# Patient Record
Sex: Female | Born: 1990 | Race: White | Hispanic: No | Marital: Single | Smoking: Current every day smoker
Health system: Southern US, Community
[De-identification: ages and names within clinical notes are randomized; demographics above are authoritative.]

## PROBLEM LIST (undated history)

## (undated) DIAGNOSIS — E079 Disorder of thyroid, unspecified: Secondary | ICD-10-CM

---

## 2012-07-29 ENCOUNTER — Encounter (HOSPITAL_BASED_OUTPATIENT_CLINIC_OR_DEPARTMENT_OTHER): Payer: Self-pay

## 2012-07-29 ENCOUNTER — Emergency Department (HOSPITAL_BASED_OUTPATIENT_CLINIC_OR_DEPARTMENT_OTHER)
Admission: EM | Admit: 2012-07-29 | Discharge: 2012-07-29 | Disposition: A | Discharge status: Home / Self Care | Payer: Medicaid Other | Attending: Emergency Medicine | Admitting: Emergency Medicine

## 2012-07-29 DIAGNOSIS — Z8639 Personal history of other endocrine, nutritional and metabolic disease: Secondary | ICD-10-CM | POA: Insufficient documentation

## 2012-07-29 DIAGNOSIS — Z862 Personal history of diseases of the blood and blood-forming organs and certain disorders involving the immune mechanism: Secondary | ICD-10-CM | POA: Insufficient documentation

## 2012-07-29 DIAGNOSIS — Z3202 Encounter for pregnancy test, result negative: Secondary | ICD-10-CM | POA: Insufficient documentation

## 2012-07-29 DIAGNOSIS — F17200 Nicotine dependence, unspecified, uncomplicated: Secondary | ICD-10-CM | POA: Insufficient documentation

## 2012-07-29 DIAGNOSIS — N390 Urinary tract infection, site not specified: Secondary | ICD-10-CM | POA: Insufficient documentation

## 2012-07-29 DIAGNOSIS — J029 Acute pharyngitis, unspecified: Secondary | ICD-10-CM | POA: Insufficient documentation

## 2012-07-29 DIAGNOSIS — IMO0001 Reserved for inherently not codable concepts without codable children: Secondary | ICD-10-CM | POA: Insufficient documentation

## 2012-07-29 HISTORY — DX: Disorder of thyroid, unspecified: E07.9

## 2012-07-29 LAB — URINE MICROSCOPIC-ADD ON

## 2012-07-29 LAB — URINALYSIS, ROUTINE W REFLEX MICROSCOPIC
Ketones, ur: 15 mg/dL — AB
Nitrite: NEGATIVE
Protein, ur: NEGATIVE mg/dL
Urobilinogen, UA: 0.2 mg/dL (ref 0.0–1.0)

## 2012-07-29 LAB — PREGNANCY, URINE: Preg Test, Ur: NEGATIVE

## 2012-07-29 MED ORDER — IBUPROFEN 400 MG PO TABS
600.0000 mg | ORAL_TABLET | Freq: Once | ORAL | Status: AC
  Administered 2012-07-29: 600 mg via ORAL
  Filled 2012-07-29: qty 1

## 2012-07-29 MED ORDER — CEPHALEXIN 500 MG PO CAPS: 500.0000 mg | ORAL_CAPSULE | Freq: Four times a day (QID) | ORAL | Status: DC

## 2012-07-29 NOTE — ED Notes (Signed)
C/o fever, pain to entire back and HA-started today

## 2012-07-29 NOTE — ED Provider Notes (Signed)
Medical screening examination/treatment/procedure(s) were performed by non-physician practitioner and as supervising physician I was immediately available for consultation/collaboration.   Gwyneth Sprout, MD 07/29/12 1740

## 2012-07-29 NOTE — ED Provider Notes (Signed)
   History    CSN: [id number] Arrival date & time 07/29/12  1534  First MD Initiated Contact with Patient 07/29/12 1554     Chief Complaint  Patient presents with  . Fever   (Consider location/radiation/quality/duration/timing/severity/associated sxs/prior Treatment) Patient is a 22 y.o. female presenting with fever. The history is provided by the patient. No language interpreter was used.  Fever Temp source:  Subjective Severity:  Moderate Onset quality:  Sudden Duration:  1 day Timing:  Constant Progression:  Unchanged Chronicity:  New Relieved by:  Nothing Worsened by:  Nothing tried Ineffective treatments:  None tried Associated symptoms: myalgias and sore throat   Associated symptoms: no nausea, no rash and no vomiting    Past Medical History  Diagnosis Date  . Thyroid disease    History reviewed. No pertinent past surgical history. No family history on file. History  Substance Use Topics  . Smoking status: Current Every Day Smoker  . Smokeless tobacco: Not on file  . Alcohol Use: No   OB History   Grav Para Term Preterm Abortions TAB SAB Ect Mult Living                 Review of Systems  Constitutional: Positive for fever.  HENT: Positive for sore throat.   Respiratory: Negative.   Cardiovascular: Negative.   Gastrointestinal: Negative for nausea and vomiting.  Musculoskeletal: Positive for myalgias.  Skin: Negative for rash.    Allergies  Review of patient's allergies indicates no known allergies.  Home Medications  No current outpatient prescriptions on file. BP 104/88  Pulse 100  Temp(Src) 100.2 F (37.9 C) (Oral)  Resp 18  Ht 5' (1.524 m)  Wt 106 lb (48.081 kg)  BMI 20.7 kg/m2  SpO2 100% Physical Exam  Nursing note and vitals reviewed. Constitutional: She is oriented to person, place, and time. She appears well-developed and well-nourished.  HENT:  Right Ear: External ear normal.  Left Ear: External ear normal.  Mouth/Throat:  Oropharyngeal exudate present.  Eyes: Conjunctivae and EOM are normal. Pupils are equal, round, and reactive to light.  Neck: Normal range of motion. No rigidity.  Cardiovascular: Normal rate and regular rhythm.   Pulmonary/Chest: Effort normal and breath sounds normal.  Abdominal: Soft.  Musculoskeletal: Normal range of motion.  Lymphadenopathy:    She has no cervical adenopathy.  Neurological: She is alert and oriented to person, place, and time.  Skin: Skin is warm and dry.  Psychiatric: She has a normal mood and affect.    ED Course  Procedures (including critical care time) Labs Reviewed  URINALYSIS, ROUTINE W REFLEX MICROSCOPIC - Abnormal; Notable for the following:    Hgb urine dipstick MODERATE (*)    Ketones, ur 15 (*)    Leukocytes, UA SMALL (*)    All other components within normal limits  URINE MICROSCOPIC-ADD ON - Abnormal; Notable for the following:    Squamous Epithelial / LPF MANY (*)    Bacteria, UA MANY (*)    All other components within normal limits  RAPID STREP SCREEN  URINE CULTURE  CULTURE, GROUP A STREP  PREGNANCY, URINE   No results found. 1. UTI (lower urinary tract infection)     MDM  Will treat for OZD:GUYQI pyelo:no meningeal signs  Teressa Lower, NP 07/29/12 1708

## 2012-07-31 LAB — URINE CULTURE: Colony Count: >=100000

## 2012-07-31 LAB — CULTURE, GROUP A STREP

## 2012-08-01 ENCOUNTER — Telehealth (HOSPITAL_COMMUNITY): Payer: Self-pay | Admitting: Emergency Medicine

## 2012-08-01 NOTE — ED Notes (Signed)
Post ED Visit - Positive Culture Follow-up  Culture report reviewed by antimicrobial stewardship pharmacist: []  Wes Dulaney, Pharm.D., BCPS []  Celedonio Miyamoto, Pharm.D., BCPS []  Georgina Pillion, [address].D., BCPS []  [place], [address].D., BCPS, AAHIVP []  Estella Husk, Pharm.D., BCPS, AAHIVP [x]  Okey Regal, Pharm.D., BCPS  Positive urine culture Treated with Keflex, organism sensitive to the same and no further patient follow-up is required at this time.  Kylie A Holland 08/01/2012, 6:00 PM

## 2013-01-24 ENCOUNTER — Encounter (HOSPITAL_BASED_OUTPATIENT_CLINIC_OR_DEPARTMENT_OTHER): Payer: Self-pay | Admitting: Emergency Medicine

## 2013-01-24 ENCOUNTER — Emergency Department (HOSPITAL_BASED_OUTPATIENT_CLINIC_OR_DEPARTMENT_OTHER)
Admission: EM | Admit: 2013-01-24 | Discharge: 2013-01-24 | Disposition: A | Discharge status: Home / Self Care | Payer: Medicaid Other | Attending: Emergency Medicine | Admitting: Emergency Medicine

## 2013-01-24 DIAGNOSIS — Z792 Long term (current) use of antibiotics: Secondary | ICD-10-CM | POA: Insufficient documentation

## 2013-01-24 DIAGNOSIS — F411 Generalized anxiety disorder: Secondary | ICD-10-CM | POA: Insufficient documentation

## 2013-01-24 DIAGNOSIS — Z8639 Personal history of other endocrine, nutritional and metabolic disease: Secondary | ICD-10-CM | POA: Insufficient documentation

## 2013-01-24 DIAGNOSIS — F419 Anxiety disorder, unspecified: Secondary | ICD-10-CM

## 2013-01-24 DIAGNOSIS — F17200 Nicotine dependence, unspecified, uncomplicated: Secondary | ICD-10-CM | POA: Insufficient documentation

## 2013-01-24 DIAGNOSIS — R110 Nausea: Secondary | ICD-10-CM | POA: Insufficient documentation

## 2013-01-24 DIAGNOSIS — R630 Anorexia: Secondary | ICD-10-CM | POA: Insufficient documentation

## 2013-01-24 DIAGNOSIS — R51 Headache: Secondary | ICD-10-CM | POA: Insufficient documentation

## 2013-01-24 DIAGNOSIS — Z862 Personal history of diseases of the blood and blood-forming organs and certain disorders involving the immune mechanism: Secondary | ICD-10-CM | POA: Insufficient documentation

## 2013-01-24 MED ORDER — LORAZEPAM 1 MG PO TABS
0.5000 mg | ORAL_TABLET | Freq: Once | ORAL | Status: AC
  Administered 2013-01-24: 0.5 mg via ORAL
  Filled 2013-01-24: qty 1

## 2013-01-24 NOTE — ED Provider Notes (Signed)
CSN: [id number]     Arrival date & time 01/24/13  1126 History   First MD Initiated Contact with Patient 01/24/13 1131     Chief Complaint  Patient presents with  . Anxiety   (Consider location/radiation/quality/duration/timing/severity/associated sxs/prior Treatment) The history is provided by the patient and medical records.   This is a 22 year old female with past medical history significant for thyroid disease, anxiety, presented to the ED for increased anxiety over the past week. Patient states she is under a great deal of stress-- currently in a custody battle over her daughter and is moving out of state soon.  States she has had decreased appetite and has feelings of nausea when trying to force herself to eat.  Has had multiple panic attacks over the past few days.  States 2 days ago she had a syncopal episodes following panic attack and hit the left side of her head on the floor.  Denies LOC.  Has been having mild headache ever since.  Headache described as a throbbing sensation across her entire forehead. No associated photophobia, phonophobia, aura, dizziness, tinnitus, visual disturbance, neck pain, or changes in speech.  Pt states she used to take valium for anxiety several years ago but states it never helped.  Denies possibility of pregnancy-- has mirena IUD.  No meds taken for headache PTA.  Denies SI/HI/AVH.  Past Medical History  Diagnosis Date  . Thyroid disease    History reviewed. No pertinent past surgical history. No family history on file. History  Substance Use Topics  . Smoking status: Current Every Day Smoker  . Smokeless tobacco: Not on file  . Alcohol Use: No   OB History   Grav Para Term Preterm Abortions TAB SAB Ect Mult Living                 Review of Systems  Neurological: Positive for headaches.  Psychiatric/Behavioral: The patient is nervous/anxious.   All other systems reviewed and are negative.    Allergies  Review of patient's allergies  indicates no known allergies.  Home Medications   Current Outpatient Rx  Name  Route  Sig  Dispense  Refill  . cephALEXin (KEFLEX) 500 MG capsule   Oral   Take 1 capsule (500 mg total) by mouth 4 (four) times daily.   28 capsule   0    BP 108/73  Pulse 80  Temp(Src) 98.4 F (36.9 C) (Oral)  Resp 20  Ht 5' (1.524 m)  Wt 105 lb (47.628 kg)  BMI 20.51 kg/m2  SpO2 100%  Physical Exam  Nursing note and vitals reviewed. Constitutional: She is oriented to person, place, and time. She appears well-developed and well-nourished. No distress.  Sitting upright in bed, all room lights on, NAD  HENT:  Head: Normocephalic and atraumatic. Head is without raccoon's eyes, without Battle's sign, without abrasion and without laceration.  Mouth/Throat: Oropharynx is clear and moist.  No visible signs of head trauma  Eyes: Conjunctivae and EOM are normal. Pupils are equal, round, and reactive to light.  Neck: Normal range of motion and full passive range of motion without pain. Neck supple. No rigidity.  No meningeal signs  Cardiovascular: Normal rate, regular rhythm and normal heart sounds.   Pulmonary/Chest: Effort normal and breath sounds normal.  Abdominal: Soft. Bowel sounds are normal.  Musculoskeletal: Normal range of motion.  Neurological: She is alert and oriented to person, place, and time. She has normal strength. She displays no tremor. No cranial nerve deficit or  sensory deficit. She displays no seizure activity. Gait normal.  No focal neuro deficits appreciated  Skin: Skin is warm and dry. She is not diaphoretic.  Psychiatric: She has a normal mood and affect. Her speech is normal. She is not actively hallucinating. She expresses no homicidal and no suicidal ideation. She expresses no suicidal plans and no homicidal plans.  Pt with flat affect, limited eye contact, does not appear anxious    ED Course  Procedures (including critical care time) Labs Review Labs Reviewed - No  data to display Imaging Review No results found.  EKG Interpretation   None       MDM   1. Anxiety   2. Headache    Pt denies possibility of pregnancy, IUD in place.  0.5mg  ativan given without improvement-- pt states she does not want to take any further medications in the ED or on OP basis.  Headache without focal neuro deficits-- i doubt TIA, stroke, ICH, SAH, or meningitis.  Offered referral to psychiatry/counseling center-- pt is moving out of state at the end of the week.  Strongly encouraged her to seek psychiatric/counsling care once settled, may call help line if begin experiencing SI/HI/AVH.  Discussed plan with pt, she agreed.  Return precautions advised.  Garlon Hatchet, PA-C 01/24/13 1432

## 2013-01-24 NOTE — ED Notes (Signed)
States she cant eat or sleep for the past week. Hx of anxiety. States she had a panic attack 2 days ago and fell hitting her forehead. No loc. She has had a headache since.

## 2013-01-24 NOTE — ED Provider Notes (Signed)
Medical screening examination/treatment/procedure(s) were performed by non-physician practitioner and as supervising physician I was immediately available for consultation/collaboration.  EKG Interpretation   None         Candyce Churn, MD 01/24/13 [phone]

## 2014-08-15 ENCOUNTER — Encounter (HOSPITAL_BASED_OUTPATIENT_CLINIC_OR_DEPARTMENT_OTHER): Payer: Self-pay

## 2014-08-15 ENCOUNTER — Emergency Department (HOSPITAL_BASED_OUTPATIENT_CLINIC_OR_DEPARTMENT_OTHER)
Admission: EM | Admit: 2014-08-15 | Discharge: 2014-08-15 | Disposition: A | Discharge status: Home / Self Care | Payer: Medicaid Other | Attending: Emergency Medicine | Admitting: Emergency Medicine

## 2014-08-15 DIAGNOSIS — Z3202 Encounter for pregnancy test, result negative: Secondary | ICD-10-CM | POA: Diagnosis not present

## 2014-08-15 DIAGNOSIS — Z720 Tobacco use: Secondary | ICD-10-CM | POA: Diagnosis not present

## 2014-08-15 DIAGNOSIS — R112 Nausea with vomiting, unspecified: Secondary | ICD-10-CM | POA: Diagnosis not present

## 2014-08-15 DIAGNOSIS — R197 Diarrhea, unspecified: Secondary | ICD-10-CM

## 2014-08-15 DIAGNOSIS — N390 Urinary tract infection, site not specified: Secondary | ICD-10-CM

## 2014-08-15 DIAGNOSIS — Z8639 Personal history of other endocrine, nutritional and metabolic disease: Secondary | ICD-10-CM | POA: Diagnosis not present

## 2014-08-15 LAB — COMPREHENSIVE METABOLIC PANEL
ALT: 7 U/L — AB (ref 14–54)
AST: 13 U/L — ABNORMAL LOW (ref 15–41)
Albumin: 4.2 g/dL (ref 3.5–5.0)
Alkaline Phosphatase: 75 U/L (ref 38–126)
Anion gap: 5 (ref 5–15)
BUN: 9 mg/dL (ref 6–20)
CALCIUM: 8.8 mg/dL — AB (ref 8.9–10.3)
CHLORIDE: 106 mmol/L (ref 101–111)
CO2: 25 mmol/L (ref 22–32)
Creatinine, Ser: 0.7 mg/dL (ref 0.44–1.00)
GFR calc Af Amer: >60 mL/min (ref >60)
GFR calc non Af Amer: >60 mL/min (ref >60)
Glucose, Bld: 90 mg/dL (ref 65–99)
POTASSIUM: 3.9 mmol/L (ref 3.5–5.1)
Sodium: 136 mmol/L (ref 135–145)
Total Bilirubin: 1.1 mg/dL (ref 0.3–1.2)
Total Protein: 7.3 g/dL (ref 6.5–8.1)

## 2014-08-15 LAB — URINALYSIS, ROUTINE W REFLEX MICROSCOPIC
BILIRUBIN URINE: NEGATIVE
Glucose, UA: NEGATIVE mg/dL
Ketones, ur: NEGATIVE mg/dL
Nitrite: NEGATIVE
PROTEIN: NEGATIVE mg/dL
SPECIFIC GRAVITY, URINE: 1.015 (ref 1.005–1.030)
UROBILINOGEN UA: 1 mg/dL (ref 0.0–1.0)
pH: 6.5 (ref 5.0–8.0)

## 2014-08-15 LAB — CBC WITH DIFFERENTIAL/PLATELET
Basophils Absolute: 0 10*3/uL (ref 0.0–0.1)
Basophils Relative: 0 % (ref 0–1)
EOS ABS: 0.4 10*3/uL (ref 0.0–0.7)
EOS PCT: 5 % (ref 0–5)
HEMATOCRIT: 38.6 % (ref 36.0–46.0)
Hemoglobin: 13 g/dL (ref 12.0–15.0)
LYMPHS ABS: 1.2 10*3/uL (ref 0.7–4.0)
LYMPHS PCT: 14 % (ref 12–46)
MCH: 31.1 pg (ref 26.0–34.0)
MCHC: 33.7 g/dL (ref 30.0–36.0)
MCV: 92.3 fL (ref 78.0–100.0)
MONO ABS: 0.5 10*3/uL (ref 0.1–1.0)
MONOS PCT: 7 % (ref 3–12)
NEUTROS ABS: 6.2 10*3/uL (ref 1.7–7.7)
Neutrophils Relative %: 74 % (ref 43–77)
Platelets: 198 10*3/uL (ref 150–400)
RBC: 4.18 MIL/uL (ref 3.87–5.11)
RDW: 11.8 % (ref 11.5–15.5)
WBC: 8.4 10*3/uL (ref 4.0–10.5)

## 2014-08-15 LAB — URINE MICROSCOPIC-ADD ON

## 2014-08-15 LAB — LIPASE, BLOOD: Lipase: 14 U/L — ABNORMAL LOW (ref 22–51)

## 2014-08-15 LAB — PREGNANCY, URINE: PREG TEST UR: NEGATIVE

## 2014-08-15 MED ORDER — SODIUM CHLORIDE 0.9 % IV BOLUS (SEPSIS)
1000.0000 mL | Freq: Once | INTRAVENOUS | Status: AC
  Administered 2014-08-15: 1000 mL via INTRAVENOUS

## 2014-08-15 MED ORDER — ONDANSETRON HCL 4 MG/2ML IJ SOLN
4.0000 mg | Freq: Once | INTRAMUSCULAR | Status: AC
  Administered 2014-08-15: 4 mg via INTRAVENOUS
  Filled 2014-08-15: qty 2

## 2014-08-15 MED ORDER — CEPHALEXIN 500 MG PO CAPS: 500.0000 mg | ORAL_CAPSULE | Freq: Four times a day (QID) | ORAL | Status: AC

## 2014-08-15 MED ORDER — ONDANSETRON 4 MG PO TBDP: ORAL_TABLET | ORAL | Status: DC

## 2014-08-15 NOTE — Discharge Instructions (Signed)
Is important to stay well hydrated. It is also important to take all of your antibiotic as prescribed, do not save them. Follow-up with primary care. Return to ED for any worsening symptoms.  Nausea and Vomiting Nausea is a sick feeling that often comes before throwing up (vomiting). Vomiting is a reflex where stomach contents come out of your mouth. Vomiting can cause severe loss of body fluids (dehydration). Children and elderly adults can become dehydrated quickly, especially if they also have diarrhea. Nausea and vomiting are symptoms of a condition or disease. It is important to find the cause of your symptoms. CAUSES   Direct irritation of the stomach lining. This irritation can result from increased acid production (gastroesophageal reflux disease), infection, food poisoning, taking certain medicines (such as nonsteroidal anti-inflammatory drugs), alcohol use, or tobacco use.  Signals from the brain.These signals could be caused by a headache, heat exposure, an inner ear disturbance, increased pressure in the brain from injury, infection, a tumor, or a concussion, pain, emotional stimulus, or metabolic problems.  An obstruction in the gastrointestinal tract (bowel obstruction).  Illnesses such as diabetes, hepatitis, gallbladder problems, appendicitis, kidney problems, cancer, sepsis, atypical symptoms of a heart attack, or eating disorders.  Medical treatments such as chemotherapy and radiation.  Receiving medicine that makes you sleep (general anesthetic) during surgery. DIAGNOSIS Your caregiver may ask for tests to be done if the problems do not improve after a few days. Tests may also be done if symptoms are severe or if the reason for the nausea and vomiting is not clear. Tests may include:  Urine tests.  Blood tests.  Stool tests.  Cultures (to look for evidence of infection).  X-rays or other imaging studies. Test results can help your caregiver make decisions about  treatment or the need for additional tests. TREATMENT You need to stay well hydrated. Drink frequently but in small amounts.You may wish to drink water, sports drinks, clear broth, or eat frozen ice pops or gelatin dessert to help stay hydrated.When you eat, eating slowly may help prevent nausea.There are also some antinausea medicines that may help prevent nausea. HOME CARE INSTRUCTIONS   Take all medicine as directed by your caregiver.  If you do not have an appetite, do not force yourself to eat. However, you must continue to drink fluids.  If you have an appetite, eat a normal diet unless your caregiver tells you differently.  Eat a variety of complex carbohydrates (rice, wheat, potatoes, bread), lean meats, yogurt, fruits, and vegetables.  Avoid high-fat foods because they are more difficult to digest.  Drink enough water and fluids to keep your urine clear or pale yellow.  If you are dehydrated, ask your caregiver for specific rehydration instructions. Signs of dehydration may include:  Severe thirst.  Dry lips and mouth.  Dizziness.  Dark urine.  Decreasing urine frequency and amount.  Confusion.  Rapid breathing or pulse. SEEK IMMEDIATE MEDICAL CARE IF:   You have blood or brown flecks (like coffee grounds) in your vomit.  You have black or bloody stools.  You have a severe headache or stiff neck.  You are confused.  You have severe abdominal pain.  You have chest pain or trouble breathing.  You do not urinate at least once every 8 hours.  You develop cold or clammy skin.  You continue to vomit for longer than 24 to 48 hours.  You have a fever. MAKE SURE YOU:   Understand these instructions.  Will watch your condition.  Will get help right away if you are not doing well or get worse. Document Released: 01/20/2005 Document Revised: 04/14/2011 Document Reviewed: 06/19/2010 [facility] Patient Information 2015 [place], [place]. This information is not  intended to replace advice given to you by your health care provider. Make sure you discuss any questions you have with your health care provider.  Urinary Tract Infection Urinary tract infections (UTIs) can develop anywhere along your urinary tract. Your urinary tract is your body's drainage system for removing wastes and extra water. Your urinary tract includes two kidneys, two ureters, a bladder, and a urethra. Your kidneys are a pair of bean-shaped organs. Each kidney is about the size of your fist. They are located below your ribs, one on each side of your spine. CAUSES Infections are caused by microbes, which are microscopic organisms, including fungi, viruses, and bacteria. These organisms are so small that they can only be seen through a microscope. Bacteria are the microbes that most commonly cause UTIs. SYMPTOMS  Symptoms of UTIs may vary by age and gender of the patient and by the location of the infection. Symptoms in young women typically include a frequent and intense urge to urinate and a painful, burning feeling in the bladder or urethra during urination. Older women and men are more likely to be tired, shaky, and weak and have muscle aches and abdominal pain. A fever may mean the infection is in your kidneys. Other symptoms of a kidney infection include pain in your back or sides below the ribs, nausea, and vomiting. DIAGNOSIS To diagnose a UTI, your caregiver will ask you about your symptoms. Your caregiver also will ask to provide a urine sample. The urine sample will be tested for bacteria and white blood cells. White blood cells are made by your body to help fight infection. TREATMENT  Typically, UTIs can be treated with medication. Because most UTIs are caused by a bacterial infection, they usually can be treated with the use of antibiotics. The choice of antibiotic and length of treatment depend on your symptoms and the type of bacteria causing your infection. HOME CARE  INSTRUCTIONS  If you were prescribed antibiotics, take them exactly as your caregiver instructs you. Finish the medication even if you feel better after you have only taken some of the medication.  Drink enough water and fluids to keep your urine clear or pale yellow.  Avoid caffeine, tea, and carbonated beverages. They tend to irritate your bladder.  Empty your bladder often. Avoid holding urine for long periods of time.  Empty your bladder before and after sexual intercourse.  After a bowel movement, women should cleanse from front to back. Use each tissue only once. SEEK MEDICAL CARE IF:   You have back pain.  You develop a fever.  Your symptoms do not begin to resolve within 3 days. SEEK IMMEDIATE MEDICAL CARE IF:   You have severe back pain or lower abdominal pain.  You develop chills.  You have nausea or vomiting.  You have continued burning or discomfort with urination. MAKE SURE YOU:   Understand these instructions.  Will watch your condition.  Will get help right away if you are not doing well or get worse. Document Released: 10/30/2004 Document Revised: 07/22/2011 Document Reviewed: 02/28/2011 [facility] Patient Information 2015 [place], [place]. This information is not intended to replace advice given to you by your health care provider. Make sure you discuss any questions you have with your health care provider.

## 2014-08-15 NOTE — ED Notes (Signed)
Diarrhea x 3 days, vomiting started yesterday

## 2014-08-15 NOTE — ED Provider Notes (Signed)
CSN: [id number]     Arrival date & time 08/15/14  1622 History   First MD Initiated Contact with Patient 08/15/14 1636     Chief Complaint  Patient presents with  . Diarrhea     (Consider location/radiation/quality/duration/timing/severity/associated sxs/prior Treatment) HPI Misty BailiffJessica Bruso is a 24 y.o. female who comes in for evaluation of nausea, vomiting and diarrhea. Patient states for the past 3 days she has had nausea, vomiting and diarrhea. She denies any bloody emesis or stool. She denies any overt abdominal pain. She reports more of a "bloating and tight sensation". She denies any pelvic pain, vaginal bleeding or discharge, urinary symptoms. She reports fevers at home 3 days ago of 102.3, but none since. Denies recent travel, new medications, new foods. Denies any allergies. No other aggravating or modifying factors.  Past Medical History  Diagnosis Date  . Thyroid disease    History reviewed. No pertinent past surgical history. No family history on file. History  Substance Use Topics  . Smoking status: Current Every Day Smoker  . Smokeless tobacco: Not on file  . Alcohol Use: No   OB History    No data available     Review of Systems A 10 point review of systems was completed and was negative except for pertinent positives and negatives as mentioned in the history of present illness     Allergies  Review of patient's allergies indicates no known allergies.  Home Medications   Prior to Admission medications   Medication Sig Start Date End Date Taking? Authorizing Provider  cephALEXin (KEFLEX) 500 MG capsule Take 1 capsule (500 mg total) by mouth 4 (four) times daily. 08/15/14   Joycie PeekBenjamin Sawyer Kahan, PA-C  ondansetron (ZOFRAN ODT) 4 MG disintegrating tablet 4mg  ODT q4 hours prn nausea/vomit 08/15/14   Narvel Kozub, PA-C   BP 91/56 mmHg  Pulse 53  Temp(Src) 98.5 F (36.9 C) (Oral)  Resp 16  Ht 5' (1.524 m)  Wt 115 lb (52.164 kg)  BMI 22.46 kg/m2  SpO2 100%   LMP 06/15/2014 Physical Exam  Constitutional: She is oriented to person, place, and time. She appears well-developed and well-nourished.  HENT:  Head: Normocephalic and atraumatic.  Mouth/Throat: Oropharynx is clear and moist.  Eyes: Conjunctivae are normal. Pupils are equal, round, and reactive to light. Right eye exhibits no discharge. Left eye exhibits no discharge. No scleral icterus.  Neck: Neck supple.  Cardiovascular: Normal rate, regular rhythm and normal heart sounds.   Pulmonary/Chest: Effort normal and breath sounds normal. No respiratory distress. She has no wheezes. She has no rales.  Abdominal: Soft. She exhibits no distension and no mass. There is no tenderness. There is no rebound and no guarding.  Musculoskeletal: She exhibits no tenderness.  Neurological: She is alert and oriented to person, place, and time.  Cranial Nerves II-XII grossly intact  Skin: Skin is warm and dry. No rash noted.  Psychiatric: She has a normal mood and affect.  Nursing note and vitals reviewed.   ED Course  Procedures (including critical care time) Labs Review Labs Reviewed  URINALYSIS, ROUTINE W REFLEX MICROSCOPIC ([facility]) - Abnormal; Notable for the following:    APPearance CLOUDY (*)    Hgb urine dipstick TRACE (*)    Leukocytes, UA MODERATE (*)    All other components within normal limits  LIPASE, BLOOD - Abnormal; Notable for the following:    Lipase 14 (*)    All other components within normal limits  COMPREHENSIVE METABOLIC PANEL - Abnormal;  Notable for the following:    Calcium 8.8 (*)    AST 13 (*)    ALT 7 (*)    All other components within normal limits  URINE MICROSCOPIC-ADD ON - Abnormal; Notable for the following:    Squamous Epithelial / LPF FEW (*)    Bacteria, UA MANY (*)    All other components within normal limits  PREGNANCY, URINE  CBC WITH DIFFERENTIAL/PLATELET    Imaging Review No results found.   EKG Interpretation None     Meds given in  ED:  Medications  sodium chloride 0.9 % bolus 1,000 mL (0 mLs Intravenous Stopped 08/15/14 1807)  ondansetron (ZOFRAN) injection 4 mg (4 mg Intravenous Given 08/15/14 1706)    Discharge Medication List as of 08/15/2014  6:02 PM    START taking these medications   Details  cephALEXin (KEFLEX) 500 MG capsule Take 1 capsule (500 mg total) by mouth 4 (four) times daily., Starting 08/15/2014, Until Discontinued, Print    ondansetron (ZOFRAN ODT) 4 MG disintegrating tablet  ODT q4 hours prn nausea/vomit, Print       Filed Vitals:   08/15/14 1626 08/15/14 1803  BP: 103/66 91/56  Pulse: 84 53  Temp: 98.5 F (36.9 C)   TempSrc: Oral   Resp: 18 16  Height: 5' (1.524 m)   Weight: 115 lb (52.164 kg)   SpO2: 100% 100%    MDM  Vitals stable - WNL -afebrile Pt resting comfortably in ED. Tolerating PO in the ED PE--physical exam is grossly unremarkable. Benign abdominal exam Labwork--evidence of UTI on urinalysis. Pregnancy negative. Labs otherwise noncontributory.  DDX--patient discomfort likely secondary to UTI and possible concomitant viral etiology.. Will treat empirically with antibiotic. No evidence of other acute or emergent pathology. Encourage follow-up with primary care.  I discussed all relevant lab findings and imaging results with pt and they verbalized understanding. Discussed f/u with PCP within 48 hrs and return precautions, pt very amenable to plan.  Final diagnoses:  UTI (lower urinary tract infection)  Nausea vomiting and diarrhea        Joycie Peek, PA-C 08/15/14 1833  Geoffery Lyons, MD 08/15/14 6962

## 2014-12-02 ENCOUNTER — Encounter (HOSPITAL_BASED_OUTPATIENT_CLINIC_OR_DEPARTMENT_OTHER): Payer: Self-pay

## 2014-12-02 ENCOUNTER — Emergency Department (HOSPITAL_BASED_OUTPATIENT_CLINIC_OR_DEPARTMENT_OTHER): Payer: Medicaid Other

## 2014-12-02 ENCOUNTER — Emergency Department (HOSPITAL_BASED_OUTPATIENT_CLINIC_OR_DEPARTMENT_OTHER)
Admission: EM | Admit: 2014-12-02 | Discharge: 2014-12-02 | Disposition: A | Discharge status: Home / Self Care | Payer: Medicaid Other | Attending: Emergency Medicine | Admitting: Emergency Medicine

## 2014-12-02 DIAGNOSIS — A5909 Other urogenital trichomoniasis: Secondary | ICD-10-CM | POA: Diagnosis not present

## 2014-12-02 DIAGNOSIS — R52 Pain, unspecified: Secondary | ICD-10-CM

## 2014-12-02 DIAGNOSIS — Z3202 Encounter for pregnancy test, result negative: Secondary | ICD-10-CM | POA: Insufficient documentation

## 2014-12-02 DIAGNOSIS — Z8639 Personal history of other endocrine, nutritional and metabolic disease: Secondary | ICD-10-CM | POA: Diagnosis not present

## 2014-12-02 DIAGNOSIS — Z792 Long term (current) use of antibiotics: Secondary | ICD-10-CM | POA: Diagnosis not present

## 2014-12-02 DIAGNOSIS — N72 Inflammatory disease of cervix uteri: Secondary | ICD-10-CM

## 2014-12-02 DIAGNOSIS — Z720 Tobacco use: Secondary | ICD-10-CM | POA: Diagnosis not present

## 2014-12-02 DIAGNOSIS — Z975 Presence of (intrauterine) contraceptive device: Secondary | ICD-10-CM

## 2014-12-02 DIAGNOSIS — R102 Pelvic and perineal pain: Secondary | ICD-10-CM | POA: Diagnosis present

## 2014-12-02 LAB — URINE MICROSCOPIC-ADD ON

## 2014-12-02 LAB — URINALYSIS, ROUTINE W REFLEX MICROSCOPIC
Bilirubin Urine: NEGATIVE
Glucose, UA: NEGATIVE mg/dL
Ketones, ur: 15 mg/dL — AB
Nitrite: NEGATIVE
Protein, ur: 30 mg/dL — AB
Specific Gravity, Urine: 1.021 (ref 1.005–1.030)
UROBILINOGEN UA: 1 mg/dL (ref 0.0–1.0)
pH: 6 (ref 5.0–8.0)

## 2014-12-02 LAB — WET PREP, GENITAL: Trich, Wet Prep: NONE SEEN

## 2014-12-02 LAB — PREGNANCY, URINE: PREG TEST UR: NEGATIVE

## 2014-12-02 MED ORDER — AZITHROMYCIN 250 MG PO TABS
1000.0000 mg | ORAL_TABLET | Freq: Once | ORAL | Status: AC
  Administered 2014-12-02: 1000 mg via ORAL
  Filled 2014-12-02: qty 4

## 2014-12-02 MED ORDER — CEFTRIAXONE SODIUM 1 G IJ SOLR
INTRAMUSCULAR | Status: AC
  Filled 2014-12-02: qty 10

## 2014-12-02 MED ORDER — SODIUM CHLORIDE 0.9 % IV BOLUS (SEPSIS)
1000.0000 mL | Freq: Once | INTRAVENOUS | Status: AC
  Administered 2014-12-02: 1000 mL via INTRAVENOUS

## 2014-12-02 MED ORDER — OXYCODONE-ACETAMINOPHEN 5-325 MG PO TABS
2.0000 | ORAL_TABLET | Freq: Once | ORAL | Status: AC
  Administered 2014-12-02: 2 via ORAL
  Filled 2014-12-02: qty 2

## 2014-12-02 MED ORDER — ONDANSETRON 4 MG PO TBDP
4.0000 mg | ORAL_TABLET | Freq: Once | ORAL | Status: AC
  Administered 2014-12-02: 4 mg via ORAL
  Filled 2014-12-02: qty 1

## 2014-12-02 MED ORDER — DEXTROSE 5 % IV SOLN
1.0000 g | Freq: Once | INTRAVENOUS | Status: AC
  Administered 2014-12-02: 1 g via INTRAVENOUS

## 2014-12-02 MED ORDER — METRONIDAZOLE 500 MG PO TABS
2000.0000 mg | ORAL_TABLET | Freq: Once | ORAL | Status: AC
  Administered 2014-12-02: 2000 mg via ORAL
  Filled 2014-12-02: qty 4

## 2014-12-02 NOTE — ED Notes (Signed)
Pt reports one week history of pelvic pain, with vaginal discharge.

## 2014-12-02 NOTE — ED Provider Notes (Signed)
CSN: [id number]     Arrival date & time 12/02/14  1727 History  By signing my name below, I, Misty Chaney, attest that this documentation has been prepared under the direction and in the presence of Margarita Grizzle, MD. Electronically Signed: Budd Chaney, ED Scribe. 12/02/2014. 6:56 PM.     Chief Complaint  Patient presents with  . Pelvic Pain   The history is provided by the patient. No language interpreter was used.   HPI Comments: Misty Chaney is a 24 y.o. female smoker who presents to the Emergency Department complaining of sharp, stabbing pelvic pain onset 4 days ago. She reports associated vaginal discharge (onset 2 days ago) and emesis. She believes this may be due to her IUD, which she has had for the past 3 years. She notes she is scheduled for her yearly pap-smear at the health clinic. She notes she has a 5 year old child, but notes mo other pregnancies since then. She states she has had 1 sexual partner in the past year and was last sexually active last week. She states that she has had 4 sexual partners throughout her lifetime. Pt denies a PMHx of STD's, but states she has a PMHx of yeast infections. She also notes a PMHx of frequent UTI's. She denies any abnormal menstrual cycles, as well as urinary symptoms.  Past Medical History  Diagnosis Date  . Thyroid disease    History reviewed. No pertinent past surgical history. History reviewed. No pertinent family history. Social History  Substance Use Topics  . Smoking status: Current Every Day Smoker  . Smokeless tobacco: None  . Alcohol Use: No   OB History    No data available     Review of Systems  Genitourinary: Positive for vaginal discharge and pelvic pain.  All other systems reviewed and are negative.   Allergies  Review of patient's allergies indicates no known allergies.  Home Medications   Prior to Admission medications   Medication Sig Start Date End Date Taking? Authorizing Provider  cephALEXin  (KEFLEX) 500 MG capsule Take 1 capsule (500 mg total) by mouth 4 (four) times daily. 08/15/14   Joycie Peek, PA-C  ondansetron (ZOFRAN ODT) 4 MG disintegrating tablet  ODT q4 hours prn nausea/vomit 08/15/14   Joycie Peek, PA-C   BP 110/83 mmHg  Pulse 77  Temp(Src) 98.2 F (36.8 C) (Oral)  Resp 20  Ht 5' (1.524 m)  Wt 105 lb 4 oz (47.741 kg)  BMI 20.56 kg/m2  SpO2 97%  LMP 10/02/2014 Physical Exam  Constitutional: She appears well-developed and well-nourished. No distress.  HENT:  Head: Normocephalic and atraumatic.  Right Ear: External ear normal.  Left Ear: External ear normal.  Nose: Nose normal.  Eyes: Pupils are equal, round, and reactive to light.  Neck: Normal range of motion.  Cardiovascular: Normal rate and regular rhythm.   Pulmonary/Chest: Effort normal and breath sounds normal.  Abdominal: Bowel sounds are normal. There is no tenderness.  Genitourinary: Uterus is tender. Cervix exhibits friability. Cervix exhibits no discharge. Vaginal discharge found.  Mild ttp over uterus, no obvious discharge from os, some brownish discharge in vaginal vault, no masses palpable, iud strings visible.   Nursing note and vitals reviewed.   ED Course  Procedures  DIAGNOSTIC STUDIES: Oxygen Saturation is 97% on RA, adequate by my interpretation.    COORDINATION OF CARE: 6:55 PM - Discussed lab results of UTI. Performed pelvic exam with female chaperone present. Discussed plans to order antibiotics and a [place].  Pt advised of plan for treatment and pt agrees.  Labs Review Labs Reviewed  WET PREP, GENITAL - Abnormal; Notable for the following:    Yeast Wet Prep HPF POC MODERATE (*)    Clue Cells Wet Prep HPF POC MODERATE (*)    WBC, Wet Prep HPF POC TOO NUMEROUS TO COUNT (*)    All other components within normal limits  URINALYSIS, ROUTINE W REFLEX MICROSCOPIC (NOT [facility]) - Abnormal; Notable for the following:    APPearance TURBID (*)    Hgb urine dipstick LARGE (*)     Ketones, ur 15 (*)    Protein, ur 30 (*)    Leukocytes, UA LARGE (*)    All other components within normal limits  URINE MICROSCOPIC-ADD ON - Abnormal; Notable for the following:    Squamous Epithelial / LPF MANY (*)    Bacteria, UA MANY (*)    All other components within normal limits  PREGNANCY, URINE  RPR  HIV ANTIBODY (ROUTINE TESTING)  GC/CHLAMYDIA PROBE AMP ([place]) NOT [facility]    Imaging Review [place] Transvaginal Non-ob  12/02/2014  CLINICAL DATA:  Midline pelvic pain for 5 days with nausea and vomiting today, iud in place for three years, recent vaginal discharge EXAM: TRANSABDOMINAL AND TRANSVAGINAL ULTRASOUND OF PELVIS TECHNIQUE: Both transabdominal and transvaginal ultrasound examinations of the pelvis were performed. Transabdominal technique was performed for global imaging of the pelvis including uterus, ovaries, adnexal regions, and pelvic cul-de-sac. It was necessary to proceed with endovaginal exam following the transabdominal exam to visualize the endometrium. COMPARISON:  None FINDINGS: Uterus Measurements: 8.6 x 3.5 x 4.9 cm. No fibroids or other mass visualized. Endometrium Thickness: 3 mm.  Intrauterine device in place Right ovary Measurements: 45 x 23 x 29 mm. Normal appearance/no adnexal mass. Left ovary Measurements: 48 x 23 x 23 mm. Normal appearance/no adnexal mass. Other findings No free fluid. IMPRESSION: Normal pelvic ultrasound Electronically Signed   By: Esperanza Heiraymond  Rubner M.D.   On: 12/02/2014 19:30   [place] Pelvis Complete  12/02/2014  CLINICAL DATA:  Midline pelvic pain for 5 days with nausea and vomiting today, iud in place for three years, recent vaginal discharge EXAM: TRANSABDOMINAL AND TRANSVAGINAL ULTRASOUND OF PELVIS TECHNIQUE: Both transabdominal and transvaginal ultrasound examinations of the pelvis were performed. Transabdominal technique was performed for global imaging of the pelvis including uterus, ovaries, adnexal regions, and pelvic cul-de-sac. It was  necessary to proceed with endovaginal exam following the transabdominal exam to visualize the endometrium. COMPARISON:  None FINDINGS: Uterus Measurements: 8.6 x 3.5 x 4.9 cm. No fibroids or other mass visualized. Endometrium Thickness: 3 mm.  Intrauterine device in place Right ovary Measurements: 45 x 23 x 29 mm. Normal appearance/no adnexal mass. Left ovary Measurements: 48 x 23 x 23 mm. Normal appearance/no adnexal mass. Other findings No free fluid. IMPRESSION: Normal pelvic ultrasound Electronically Signed   By: Esperanza Heiraymond  Rubner M.D.   On: 12/02/2014 19:30   I have personally reviewed and evaluated these images and lab results as part of my medical decision-making.   EKG Interpretation None      MDM   Final diagnoses:  Cervicitis  IUD (intrauterine device) in place  Trichomonal cervicitis  Patient treated here with rocephin, zithromax, and flagyl.  Patient advised regarding return precautions and need for follow up.   I personally performed the services described in this documentation, which was scribed in my presence. The recorded information has been reviewed and considered.   Margarita Grizzleanielle Decie Verne, MD  12/02/14 1952 

## 2014-12-02 NOTE — Discharge Instructions (Signed)
Please recheck with your gynecologist asap.  Return if worse especially fever or unable to keep down medicine.   Cervicitis Cervicitis is a soreness and swelling (inflammation) of the cervix. Your cervix is located at the bottom of your uterus. It opens up to the vagina. CAUSES   Sexually transmitted infections (STIs).   Allergic reaction.   Medicines or birth control devices that are put in the vagina.   Injury to the cervix.   Bacterial infections.  RISK FACTORS You are at greater risk if you:  Have unprotected sexual intercourse.  Have sexual intercourse with many partners.  Began sexual intercourse at an early age.  Have a history of STIs. SYMPTOMS  There may be no symptoms. If symptoms occur, they may include:   Gray, white, yellow, or bad-smelling vaginal discharge.   Pain or itching of the area outside the vagina.   Painful sexual intercourse.   Lower abdominal or lower back pain, especially during intercourse.   Frequent urination.   Abnormal vaginal bleeding between periods, after sexual intercourse, or after menopause.   Pressure or a heavy feeling in the pelvis.  DIAGNOSIS  Diagnosis is made after a pelvic exam. Other tests may include:   Examination of any discharge under a microscope (wet prep).   A Pap test.  TREATMENT  Treatment will depend on the cause of cervicitis. If it is caused by an STI, both you and your partner will need to be treated. Antibiotic medicines will be given.  HOME CARE INSTRUCTIONS   Do not have sexual intercourse until your health care provider says it is okay.   Do not have sexual intercourse until your partner has been treated, if your cervicitis is caused by an STI.   Take your antibiotics as directed. Finish them even if you start to feel better.  SEEK MEDICAL CARE IF:  Your symptoms come back.   You have a fever.  MAKE SURE YOU:   Understand these instructions.  Will watch your  condition.  Will get help right away if you are not doing well or get worse.   This information is not intended to replace advice given to you by your health care provider. Make sure you discuss any questions you have with your health care provider.   Document Released: 01/20/2005 Document Revised: 01/25/2013 Document Reviewed: 07/14/2012 Elsevier Interactive Patient Education [employer] Sexually Transmitted Disease A sexually transmitted disease (STD) is a disease or infection that may be passed (transmitted) from person to person, usually during sexual activity. This may happen by way of saliva, semen, blood, vaginal mucus, or urine. Common STDs include:  Gonorrhea.  Chlamydia.  Syphilis.  HIV and AIDS.  Genital herpes.  Hepatitis B and C.  Trichomonas.  Human papillomavirus (HPV).  Pubic lice.  Scabies.  Mites.  Bacterial vaginosis. WHAT ARE CAUSES OF STDs? An STD may be caused by bacteria, a virus, or parasites. STDs are often transmitted during sexual activity if one person is infected. However, they may also be transmitted through nonsexual means. STDs may be transmitted after:   Sexual intercourse with an infected person.  Sharing sex toys with an infected person.  Sharing needles with an infected person or using unclean piercing or tattoo needles.  Having intimate contact with the genitals, mouth, or rectal areas of an infected person.  Exposure to infected fluids during birth. WHAT ARE THE SIGNS AND SYMPTOMS OF STDs? Different STDs have different symptoms. Some people may not have any symptoms. If symptoms  are present, they may include:  Painful or bloody urination.  Pain in the pelvis, abdomen, vagina, anus, throat, or eyes.  A skin rash, itching, or irritation.  Growths, ulcerations, blisters, or sores in the genital and anal areas.  Abnormal vaginal discharge with or without bad odor.  Penile discharge in men.  Fever.  Pain or  bleeding during sexual intercourse.  Swollen glands in the groin area.  Yellow skin and eyes (jaundice). This is seen with hepatitis.  Swollen testicles.  Infertility.  Sores and blisters in the mouth. HOW ARE STDs DIAGNOSED? To make a diagnosis, your health care provider may:  Take a medical history.  Perform a physical exam.  Take a sample of any discharge to examine.  Swab the throat, cervix, opening to the penis, rectum, or vagina for testing.  Test a sample of your first morning urine.  Perform blood tests.  Perform a Pap test, if this applies.  Perform a colposcopy.  Perform a laparoscopy. HOW ARE STDs TREATED? Treatment depends on the STD. Some STDs may be treated but not cured.  Chlamydia, gonorrhea, trichomonas, and syphilis can be cured with antibiotic medicine.  Genital herpes, hepatitis, and HIV can be treated, but not cured, with prescribed medicines. The medicines lessen symptoms.  Genital warts from HPV can be treated with medicine or by freezing, burning (electrocautery), or surgery. Warts may come back.  HPV cannot be cured with medicine or surgery. However, abnormal areas may be removed from the cervix, vagina, or vulva.  If your diagnosis is confirmed, your recent sexual partners need treatment. This is true even if they are symptom-free or have a negative culture or evaluation. They should not have sex until their health care providers say it is okay.  Your health care provider may test you for infection again 3 months after treatment. HOW CAN I REDUCE MY RISK OF GETTING AN STD? Take these steps to reduce your risk of getting an STD:  Use latex condoms, dental dams, and water-soluble lubricants during sexual activity. Do not use petroleum jelly or oils.  Avoid having multiple sex partners.  Do not have sex with someone who has other sex partners  Do not have sex with anyone you do not know or who is at high risk for an STD.  Avoid risky sex  practices that can break your skin.  Do not have sex if you have open sores on your mouth or skin.  Avoid drinking too much alcohol or taking illegal drugs. Alcohol and drugs can affect your judgment and put you in a vulnerable position.  Avoid engaging in oral and anal sex acts.  Get vaccinated for HPV and hepatitis. If you have not received these vaccines in the past, talk to your health care provider about whether one or both might be right for you.  If you are at risk of being infected with HIV, it is recommended that you take a prescription medicine daily to prevent HIV infection. This is called pre-exposure prophylaxis (PrEP). You are considered at risk if:  You are a man who has sex with other men (MSM).  You are a heterosexual man or woman and are sexually active with more than one partner.  You take drugs by injection.  You are sexually active with a partner who has HIV.  Talk with your health care provider about whether you are at high risk of being infected with HIV. If you choose to begin PrEP, you should first be tested for HIV.  You should then be tested every 3 months for as long as you are taking PrEP. WHAT SHOULD I DO IF I THINK I HAVE AN STD?  See your health care provider.  Tell your sexual partner(s). They should be tested and treated for any STDs.  Do not have sex until your health care provider says it is okay. WHEN SHOULD I GET IMMEDIATE MEDICAL CARE? Contact your health care provider right away if:   You have severe abdominal pain.  You are a man and notice swelling or pain in your testicles.  You are a woman and notice swelling or pain in your vagina.   This information is not intended to replace advice given to you by your health care provider. Make sure you discuss any questions you have with your health care provider.   Document Released: 04/12/2002 Document Revised: 02/10/2014 Document Reviewed: 08/10/2012 Elsevier Interactive Patient Education  2016 [employer]. Trichomoniasis Trichomoniasis is an infection caused by an organism called Trichomonas. The infection can affect both women and men. In women, the outer female genitalia and the vagina are affected. In men, the penis is mainly affected, but the prostate and other reproductive organs can also be involved. Trichomoniasis is a sexually transmitted infection (STI) and is most often passed to another person through sexual contact.  RISK FACTORS  Having unprotected sexual intercourse.  Having sexual intercourse with an infected partner. SIGNS AND SYMPTOMS  Symptoms of trichomoniasis in women include:  Abnormal gray-green frothy vaginal discharge.  Itching and irritation of the vagina.  Itching and irritation of the area outside the vagina. Symptoms of trichomoniasis in men include:   Penile discharge with or without pain.  Pain during urination. This results from inflammation of the urethra. DIAGNOSIS  Trichomoniasis may be found during a Pap test or physical exam. Your health care provider may use one of the following methods to help diagnose this infection:  Testing the pH of the vagina with a test tape.  Using a vaginal swab test that checks for the Trichomonas organism. A test is available that provides results within a few minutes.  Examining a urine sample.  Testing vaginal secretions. Your health care provider may test you for other STIs, including HIV. TREATMENT   You may be given medicine to fight the infection. Women should inform their health care provider if they could be or are pregnant. Some medicines used to treat the infection should not be taken during pregnancy.  Your health care provider may recommend over-the-counter medicines or creams to decrease itching or irritation.  Your sexual partner will need to be treated if infected.  Your health care provider may test you for infection again 3 months after treatment. HOME CARE INSTRUCTIONS    Take medicines only as directed by your health care provider.  Take over-the-counter medicine for itching or irritation as directed by your health care provider.  Do not have sexual intercourse while you have the infection.  Women should not douche or wear tampons while they have the infection.  Discuss your infection with your partner. Your partner may have gotten the infection from you, or you may have gotten it from your partner.  Have your sex partner get examined and treated if necessary.  Practice safe, informed, and protected sex.  See your health care provider for other STI testing. SEEK MEDICAL CARE IF:   You still have symptoms after you finish your medicine.  You develop abdominal pain.  You have pain when you urinate.  You have bleeding after sexual intercourse.  You develop a rash.  Your medicine makes you sick or makes you throw up (vomit). MAKE SURE YOU:  Understand these instructions.  Will watch your condition.  Will get help right away if you are not doing well or get worse.   This information is not intended to replace advice given to you by your health care provider. Make sure you discuss any questions you have with your health care provider.   Document Released: 07/16/2000 Document Revised: 02/10/2014 Document Reviewed: 11/01/2012 Elsevier Interactive Patient Education [employer]

## 2014-12-04 LAB — GC/CHLAMYDIA PROBE AMP (~~LOC~~) NOT AT ARMC
CHLAMYDIA, DNA PROBE: POSITIVE — AB
NEISSERIA GONORRHEA: NEGATIVE

## 2014-12-04 LAB — HIV ANTIBODY (ROUTINE TESTING W REFLEX): HIV Screen 4th Generation wRfx: NONREACTIVE

## 2014-12-04 LAB — RPR: RPR Ser Ql: NONREACTIVE

## 2014-12-05 ENCOUNTER — Telehealth (HOSPITAL_BASED_OUTPATIENT_CLINIC_OR_DEPARTMENT_OTHER): Payer: Self-pay | Admitting: Emergency Medicine

## 2014-12-07 ENCOUNTER — Telehealth (HOSPITAL_BASED_OUTPATIENT_CLINIC_OR_DEPARTMENT_OTHER): Payer: Self-pay | Admitting: Emergency Medicine

## 2016-01-14 ENCOUNTER — Other Ambulatory Visit: Payer: Self-pay | Admitting: Family

## 2021-03-10 ENCOUNTER — Emergency Department (HOSPITAL_BASED_OUTPATIENT_CLINIC_OR_DEPARTMENT_OTHER): Payer: Medicaid Other

## 2021-03-10 ENCOUNTER — Emergency Department (HOSPITAL_BASED_OUTPATIENT_CLINIC_OR_DEPARTMENT_OTHER)
Admission: EM | Admit: 2021-03-10 | Discharge: 2021-03-10 | Disposition: A | Discharge status: Home / Self Care | Payer: Medicaid Other | Attending: Emergency Medicine | Admitting: Emergency Medicine

## 2021-03-10 ENCOUNTER — Encounter (HOSPITAL_BASED_OUTPATIENT_CLINIC_OR_DEPARTMENT_OTHER): Payer: Self-pay | Admitting: Emergency Medicine

## 2021-03-10 ENCOUNTER — Other Ambulatory Visit: Payer: Self-pay

## 2021-03-10 DIAGNOSIS — R519 Headache, unspecified: Secondary | ICD-10-CM | POA: Diagnosis present

## 2021-03-10 DIAGNOSIS — R112 Nausea with vomiting, unspecified: Secondary | ICD-10-CM

## 2021-03-10 DIAGNOSIS — M549 Dorsalgia, unspecified: Secondary | ICD-10-CM | POA: Insufficient documentation

## 2021-03-10 DIAGNOSIS — R109 Unspecified abdominal pain: Secondary | ICD-10-CM | POA: Diagnosis not present

## 2021-03-10 LAB — URINALYSIS, ROUTINE W REFLEX MICROSCOPIC
Bilirubin Urine: NEGATIVE
Glucose, UA: NEGATIVE mg/dL
Ketones, ur: 40 mg/dL — AB
Leukocytes,Ua: NEGATIVE
Nitrite: NEGATIVE
Protein, ur: NEGATIVE mg/dL
Specific Gravity, Urine: 1.025 (ref 1.005–1.030)
pH: 7 (ref 5.0–8.0)

## 2021-03-10 LAB — HCG, SERUM, QUALITATIVE: Preg, Serum: NEGATIVE

## 2021-03-10 LAB — URINALYSIS, MICROSCOPIC (REFLEX)

## 2021-03-10 MED ORDER — ONDANSETRON HCL 4 MG/2ML IJ SOLN
4.0000 mg | Freq: Once | INTRAMUSCULAR | Status: AC
Start: 2021-03-10 — End: 2021-03-10
  Administered 2021-03-10: 4 mg via INTRAVENOUS
  Filled 2021-03-10: qty 2

## 2021-03-10 MED ORDER — SODIUM CHLORIDE 0.9 % IV BOLUS
1000.0000 mL | Freq: Once | INTRAVENOUS | Status: AC
  Administered 2021-03-10: 1000 mL via INTRAVENOUS

## 2021-03-10 MED ORDER — MORPHINE SULFATE (PF) 4 MG/ML IV SOLN
4.0000 mg | Freq: Once | INTRAVENOUS | Status: DC
  Filled 2021-03-10: qty 1

## 2021-03-10 MED ORDER — DIPHENHYDRAMINE HCL 50 MG/ML IJ SOLN
25.0000 mg | Freq: Once | INTRAMUSCULAR | Status: AC
  Administered 2021-03-10: 25 mg via INTRAVENOUS
  Filled 2021-03-10: qty 1

## 2021-03-10 MED ORDER — ONDANSETRON 4 MG PO TBDP: ORAL_TABLET | ORAL | 0 refills | Status: AC

## 2021-03-10 MED ORDER — KETOROLAC TROMETHAMINE 15 MG/ML IJ SOLN
15.0000 mg | Freq: Once | INTRAMUSCULAR | Status: AC
  Administered 2021-03-10: 15 mg via INTRAVENOUS
  Filled 2021-03-10: qty 1

## 2021-03-10 MED ORDER — PROCHLORPERAZINE EDISYLATE 10 MG/2ML IJ SOLN
10.0000 mg | Freq: Once | INTRAMUSCULAR | Status: AC
Start: 2021-03-10 — End: 2021-03-10
  Administered 2021-03-10: 10 mg via INTRAVENOUS
  Filled 2021-03-10: qty 2

## 2021-03-10 NOTE — ED Triage Notes (Signed)
Pt arrives pov with c/o HA, n/v and lower back pain starting this am. Also reports feeling light headed. AOx4, VAN negative

## 2021-03-10 NOTE — ED Provider Notes (Signed)
[facility] DEPARTMENT Provider Note   CSN: [id number] Arrival date & time: 03/10/21  0746     History  Chief Complaint  Patient presents with   Headache    Misty Chaney is a 31 y.o. female.  31 yo F with a chief complaints of a headache.  She woke up this morning and then developed to a very sudden onset severe headache that caused her to have nausea and vomiting.  The nausea and vomiting has been so severe that she has been experiencing some back pain now as well.  She is worried that she could have a kidney infection but denies any urinary symptoms.  Denies trauma to the head.  States that she was well yesterday no cough congestion fever no nausea vomiting or diarrhea.  Mom added that her hands have been turning into claws as well.   Headache     Home Medications Prior to Admission medications   Medication Sig Start Date End Date Taking? Authorizing Provider  ondansetron (ZOFRAN-ODT) 4 MG disintegrating tablet 4mg  ODT q4 hours prn nausea/vomit 03/10/21  Yes Deno Etienne, DO  cephALEXin (KEFLEX) 500 MG capsule Take 1 capsule (500 mg total) by mouth 4 (four) times daily. 08/15/14   Comer Locket, PA-C      Allergies    Patient has no known allergies.    Review of Systems   Review of Systems  Neurological:  Positive for headaches.   Physical Exam Updated Vital Signs BP 105/74 (BP Location: Right Arm)    Pulse 100    Temp 99.7 F (37.6 C) (Oral)    Resp 18    Ht 5' (1.524 m)    Wt 65.8 kg    SpO2 100%    BMI 28.32 kg/m  Physical Exam Vitals and nursing note reviewed.  Constitutional:      General: She is not in acute distress.    Appearance: She is well-developed. She is not diaphoretic.  HENT:     Head: Normocephalic and atraumatic.  Eyes:     Pupils: Pupils are equal, round, and reactive to light.  Cardiovascular:     Rate and Rhythm: Normal rate and regular rhythm.     Heart sounds: No murmur heard.   No friction rub. No gallop.   Pulmonary:     Effort: Pulmonary effort is normal.     Breath sounds: No wheezing or rales.  Abdominal:     General: There is no distension.     Palpations: Abdomen is soft.     Tenderness: There is no abdominal tenderness.  Musculoskeletal:        General: No tenderness.     Cervical back: Normal range of motion and neck supple.  Skin:    General: Skin is warm and dry.  Neurological:     Mental Status: She is alert and oriented to person, place, and time.     Cranial Nerves: Cranial nerves 2-12 are intact.     Sensory: Sensation is intact.     Motor: Motor function is intact.     Coordination: Coordination is intact.     Comments: Benign neurologic exam  Psychiatric:        Behavior: Behavior normal.    ED Results / Procedures / Treatments   Labs (all labs ordered are listed, but only abnormal results are displayed) Labs Reviewed  URINALYSIS, ROUTINE W REFLEX MICROSCOPIC - Abnormal; Notable for the following components:      Result Value   APPearance  HAZY (*)    Hgb urine dipstick MODERATE (*)    Ketones, ur 40 (*)    All other components within normal limits  URINALYSIS, MICROSCOPIC (REFLEX) - Abnormal; Notable for the following components:   Bacteria, UA RARE (*)    All other components within normal limits  HCG, SERUM, QUALITATIVE    EKG None  Radiology CT Head Wo Contrast  Result Date: 03/10/2021 CLINICAL DATA:  31 year old female with sudden severe headache and low back pain on set this morning. Lightheaded. EXAM: CT HEAD WITHOUT CONTRAST TECHNIQUE: Contiguous axial images were obtained from the base of the skull through the vertex without intravenous contrast. RADIATION DOSE REDUCTION: This exam was performed according to the departmental dose-optimization program which includes automated exposure control, adjustment of the mA and/or kV according to patient size and/or use of iterative reconstruction technique. COMPARISON:  None. FINDINGS: Brain: Normal cerebral  volume. No midline shift, ventriculomegaly, mass effect, evidence of mass lesion, intracranial hemorrhage or evidence of cortically based acute infarction. Gray-white matter differentiation is within normal limits throughout the brain. Vascular: No suspicious intracranial vascular hyperdensity. Skull: Negative. Sinuses/Orbits: Visualized paranasal sinuses and mastoids are clear. Tympanic cavities are clear. Other: Mildly Disconjugate gaze but otherwise negative orbits. Visualized scalp soft tissues are within normal limits. IMPRESSION: Normal noncontrast Head CT. Electronically Signed   By: Genevie Ann M.D.   On: 03/10/2021 09:17   CT Renal Stone Study  Result Date: 03/10/2021 CLINICAL DATA:  31 year old female with sudden severe headache and low back pain on set this morning. Lightheaded. Nausea vomiting. EXAM: CT ABDOMEN AND PELVIS WITHOUT CONTRAST TECHNIQUE: Multidetector CT imaging of the abdomen and pelvis was performed following the standard protocol without IV contrast. RADIATION DOSE REDUCTION: This exam was performed according to the departmental dose-optimization program which includes automated exposure control, adjustment of the mA and/or kV according to patient size and/or use of iterative reconstruction technique. COMPARISON:  None. FINDINGS: Lower chest: Negative; minor dependent atelectasis. Hepatobiliary: Negative noncontrast liver and gallbladder. Pancreas: Negative noncontrast appearance. Spleen: Negative. Adrenals/Urinary Tract: Normal adrenal glands. Noncontrast kidneys appear symmetric, negative. No pararenal inflammation is evident. No nephrolithiasis. No convincing hydronephrosis and proximal ureters appear decompressed. Multiple pelvic phleboliths on the right side suspected. Bladder is decompressed which probably explains the appearance of bladder wall thickening. No obvious urinary calculus. Stomach/Bowel: Mildly redundant sigmoid. Mild retained stool in the rectosigmoid colon. Upstream  large bowel mostly decompressed. Normal retrocecal appendix on series 2, image 54. No large bowel inflammation identified. Decompressed terminal ileum. No dilated small bowel. Stomach and duodenum appear negative. No free air or free fluid identified. Vascular/Lymphatic: Normal caliber abdominal aorta. No calcified atherosclerosis or lymphadenopathy identified. Reproductive: Negative noncontrast appearance. Other: No pelvic free fluid identified. Musculoskeletal: Negative. No acute osseous abnormality identified. Unremarkable CT appearance of the spine. IMPRESSION: Negative noncontrast CT appearance of the abdomen and pelvis. No urinary calculus or obstructive uropathy. Normal appendix. Electronically Signed   By: Genevie Ann M.D.   On: 03/10/2021 09:23    Procedures Procedures    Medications Ordered in ED Medications  morphine (PF) 4 MG/ML injection 4 mg (0 mg Intravenous Hold 03/10/21 0844)  ketorolac (TORADOL) 15 MG/ML injection 15 mg (has no administration in time range)  ondansetron (ZOFRAN) injection 4 mg (has no administration in time range)  prochlorperazine (COMPAZINE) injection 10 mg (10 mg Intravenous Given 03/10/21 0822)  diphenhydrAMINE (BENADRYL) injection 25 mg (25 mg Intravenous Given 03/10/21 0821)  sodium chloride 0.9 % bolus 1,000 mL (1,000  mLs Intravenous New Bag/Given 03/10/21 0811)    ED Course/ Medical Decision Making/ A&P                           Medical Decision Making Amount and/or Complexity of Data Reviewed Labs: ordered. Radiology: ordered.  Risk Prescription drug management.   31 yo F with a chief complaints of a sudden onset headache.  This started about 5 AM shortly after she did woken up.  As she is describing it in a sudden onset and severe headache will obtain a CT to evaluate for subarachnoid hemorrhage.  Give a headache cocktail here.  UA to assess for UTI as the patient feels that she has had similar symptoms without but denies any urinary symptoms.  Pregnancy  test.  Patient has no meningeal signs.  Patient reassessed and now complaining of more severe flank pain than headache.  Timeline wise seems strange that she would have a kidney stone to cause her headache prior to having flank pain but will obtain a stone study due to the reported severity.  I reviewed the patients chart and she had had a child about a year ago..  I independently interpreted the patients labs and imaging UA without infection.  Pregnancy test negative.  CT head and stone study without acute finding.  Patient reassessed and now feeling a bit better.  We will give a dose of Toradol.  Have her follow-up with her family doctor.  9:32 AM:  I have discussed the diagnosis/risks/treatment options with the patient and family.  Evaluation and diagnostic testing in the emergency department does not suggest an emergent condition requiring admission or immediate intervention beyond what has been performed at this time.  They will follow up with  PCP. We also discussed returning to the ED immediately if new or worsening sx occur. We discussed the sx which are most concerning (e.g., sudden worsening pain, fever, inability to tolerate by mouth) that necessitate immediate return. Medications administered to the patient during their visit and any new prescriptions provided to the patient are listed below.  Medications given during this visit Medications  morphine (PF) 4 MG/ML injection 4 mg (0 mg Intravenous Hold 03/10/21 0844)  ketorolac (TORADOL) 15 MG/ML injection 15 mg (has no administration in time range)  ondansetron (ZOFRAN) injection 4 mg (has no administration in time range)  prochlorperazine (COMPAZINE) injection 10 mg (10 mg Intravenous Given 03/10/21 0822)  diphenhydrAMINE (BENADRYL) injection 25 mg (25 mg Intravenous Given 03/10/21 0821)  sodium chloride 0.9 % bolus 1,000 mL (1,000 mLs Intravenous New Bag/Given 03/10/21 [id number])     The patient appears reasonably screen and/or stabilized for  discharge and I doubt any other medical condition or other [facility] requiring further screening, evaluation, or treatment in the ED at this time prior to discharge.         Final Clinical Impression(s) / ED Diagnoses Final diagnoses:  Bad headache  Nausea and vomiting in adult    Rx / DC Orders ED Discharge Orders          Ordered    ondansetron (ZOFRAN-ODT) 4 MG disintegrating tablet        03/10/21 0930              Deno Etienne, DO 03/10/21 [phone]

## 2021-03-10 NOTE — ED Notes (Addendum)
Patient transported to CT 

## 2021-03-10 NOTE — ED Notes (Signed)
ED Provider at bedside. 

## 2021-03-10 NOTE — ED Notes (Signed)
Unable to provide u/a at this time 

## 2021-03-10 NOTE — Discharge Instructions (Signed)
Follow up with your family doc.  Return for worsening pain, inability to eat or drink.

## 2022-10-29 IMAGING — CT CT RENAL STONE PROTOCOL
2 of 4 series · 16 of 46 positions shown, 18 images · non-contrast
Comparison: None.

CLINICAL DATA: 30-year-old female with sudden severe headache and
low back pain on set this morning. Lightheaded. Nausea vomiting.



[Series 2: axial st · axial · 0.77mm/px · z∈[-204,+206]mm · 13 of 90 slices shown, 15 images]
[im 4/90  soft-tissue]
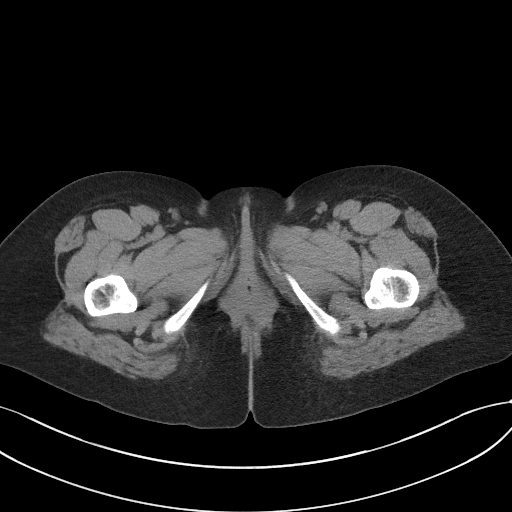
[im 4/90  bone]
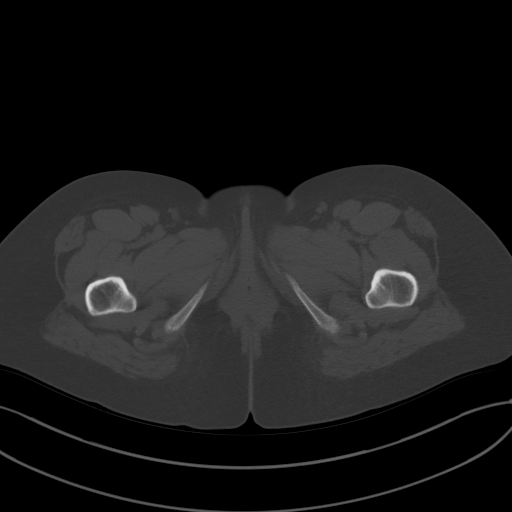
[im 11/90  soft-tissue]
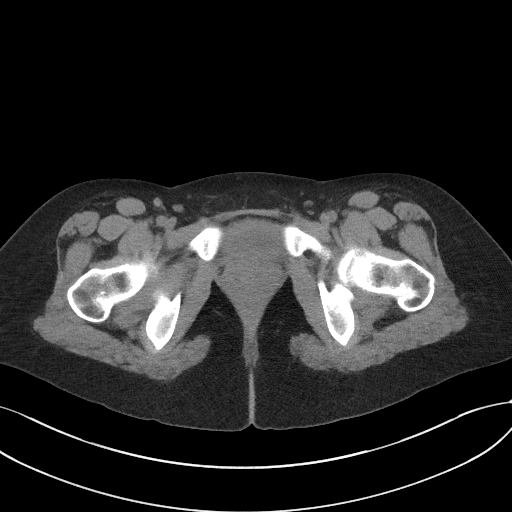
[im 18/90  soft-tissue]
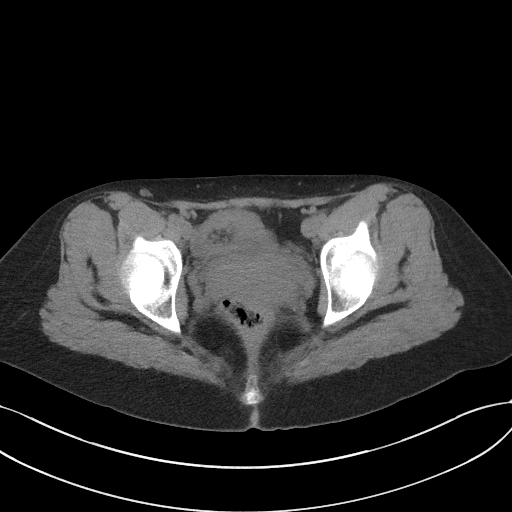
[im 24/90  soft-tissue]
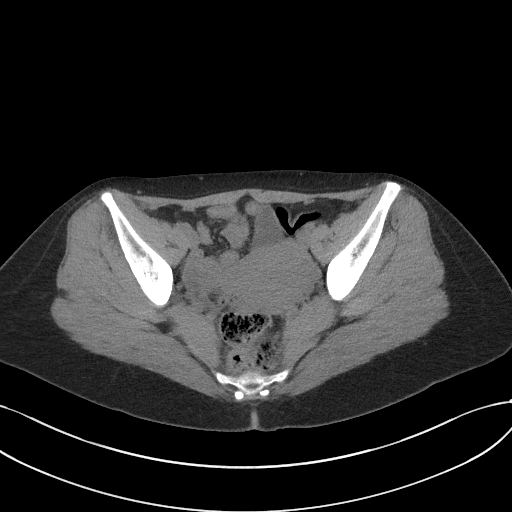
[im 31/90  soft-tissue]
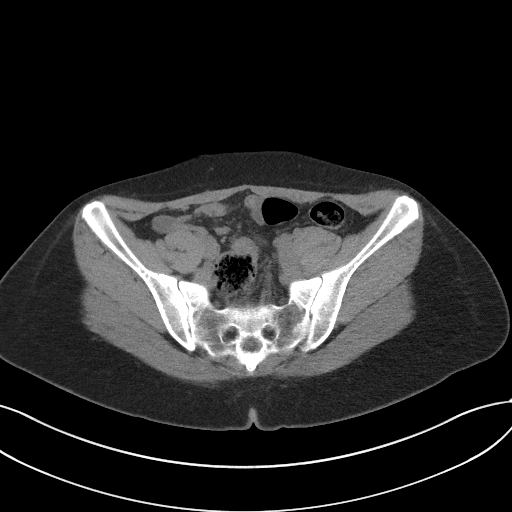
[im 38/90  soft-tissue]
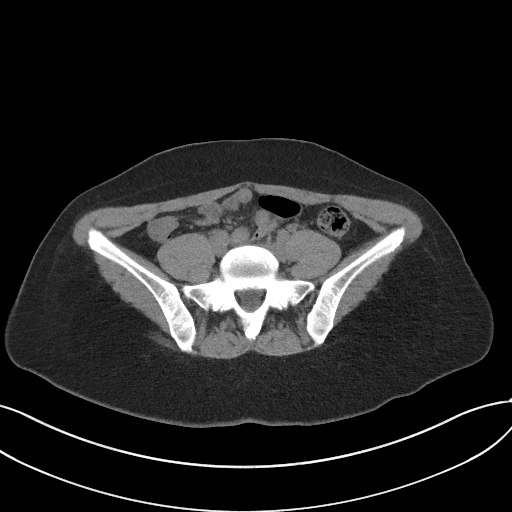
[im 45/90  soft-tissue]
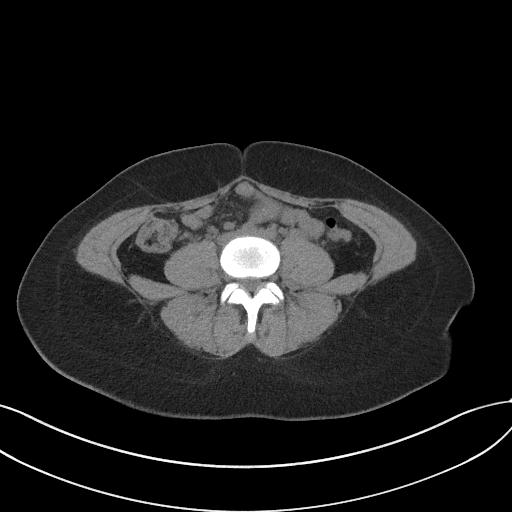
[im 52/90  soft-tissue]
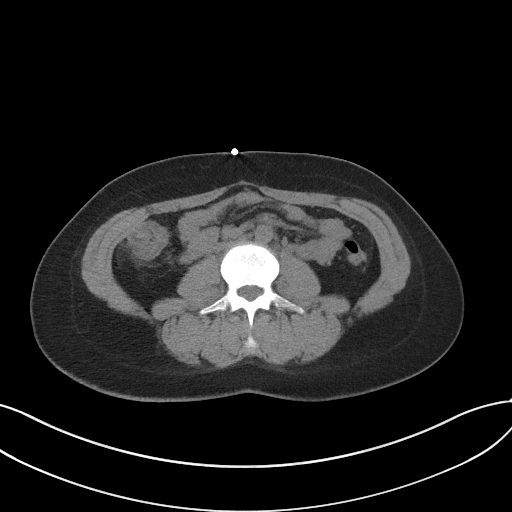
[im 59/90  soft-tissue]
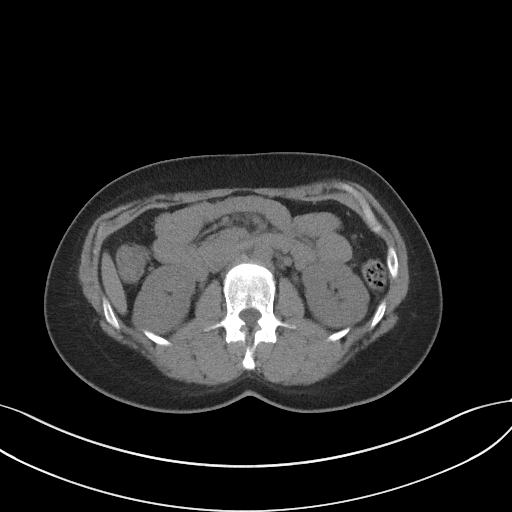
[im 59/90  bone]
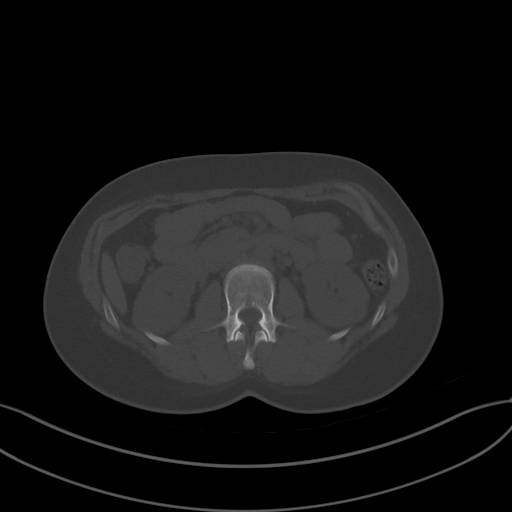
[im 66/90  soft-tissue]
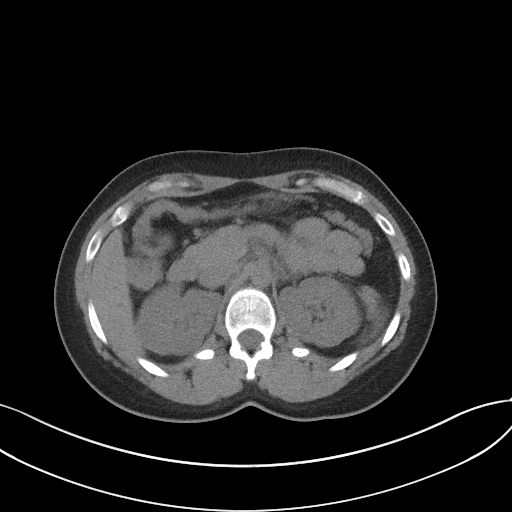
[im 72/90  soft-tissue]
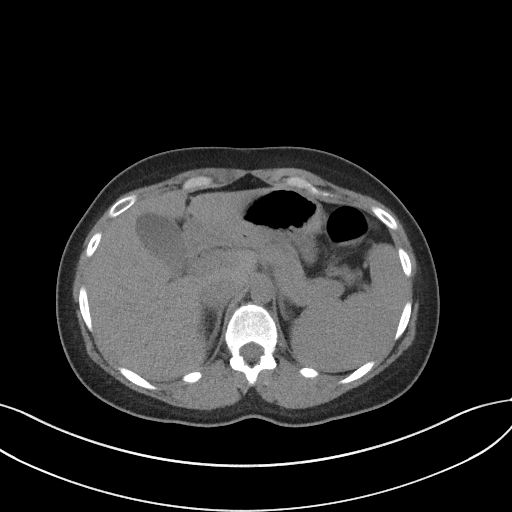
[im 79/90  soft-tissue]
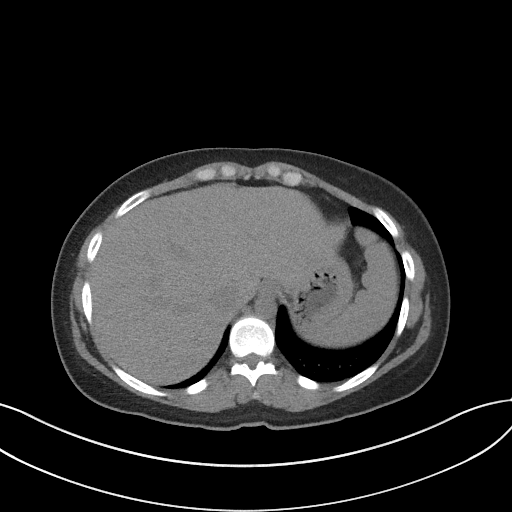
[im 86/90  soft-tissue]
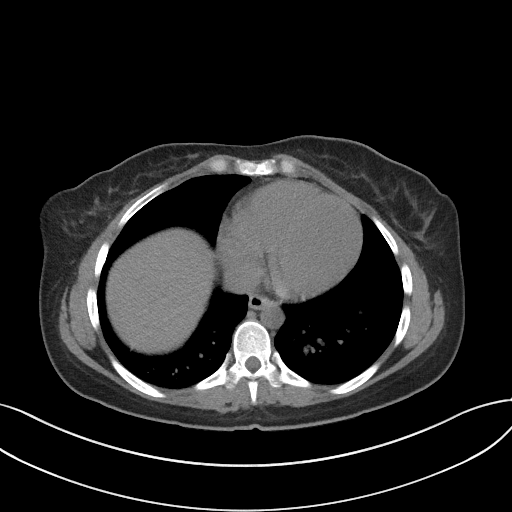

[Series 4: coronal st · coronal · 0.85mm/px · 3 of 74 slices shown]
[im 25/74  soft-tissue]
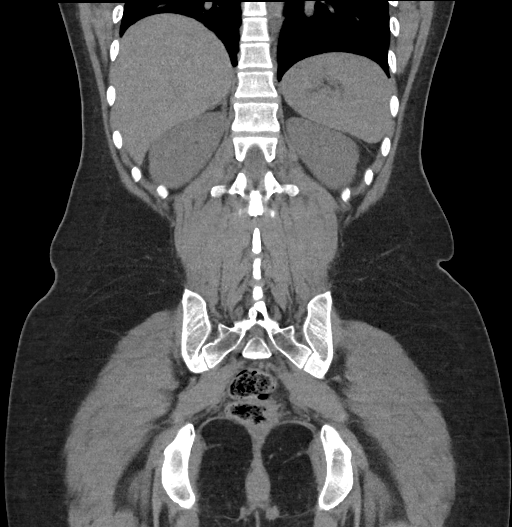
[im 33/74  soft-tissue]
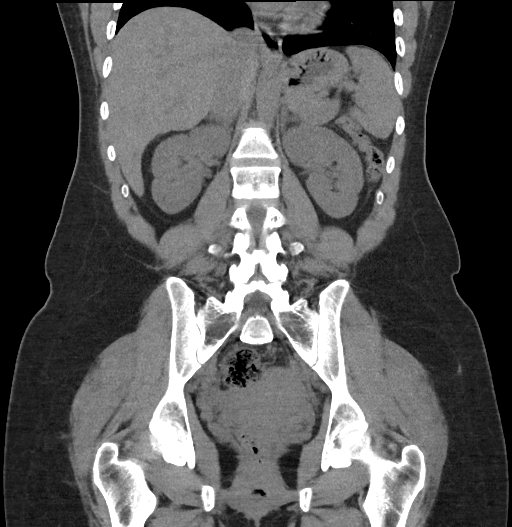
[im 41/74  soft-tissue]
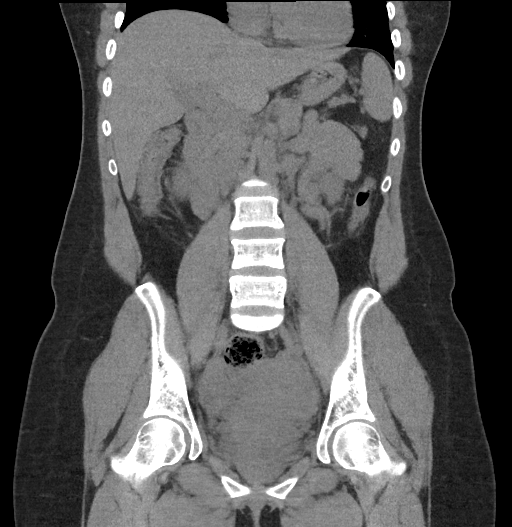

[16 of 46 positions shown; findings below may reference images not displayed]

FINDINGS: Lower chest: Negative; minor dependent atelectasis.

Hepatobiliary: Negative noncontrast liver and gallbladder.

Pancreas: Negative noncontrast appearance.

Spleen: Negative.

Adrenals/Urinary Tract: Normal adrenal glands. Noncontrast kidneys
appear symmetric, negative. No pararenal inflammation is evident. No
nephrolithiasis. No convincing hydronephrosis and proximal ureters
appear decompressed. Multiple pelvic phleboliths on the right side
suspected. Bladder is decompressed which probably explains the
appearance of bladder wall thickening. No obvious urinary calculus.

Stomach/Bowel: Mildly redundant sigmoid. Mild retained stool in the
rectosigmoid colon. Upstream large bowel mostly decompressed. Normal
retrocecal appendix on series 2, image 54. No large bowel
inflammation identified. Decompressed terminal ileum. No dilated
small bowel. Stomach and duodenum appear negative. No free air or
free fluid identified.

Vascular/Lymphatic: Normal caliber abdominal aorta. No calcified
atherosclerosis or lymphadenopathy identified.

Reproductive: Negative noncontrast appearance.

Other: No pelvic free fluid identified.

Musculoskeletal: Negative. No acute osseous abnormality identified.
Unremarkable CT appearance of the spine.
IMPRESSION: Negative noncontrast CT appearance of the abdomen and pelvis. No
urinary calculus or obstructive uropathy. Normal appendix.

## 2022-10-29 IMAGING — CT CT HEAD W/O CM
3 series · 15 of 47 positions shown, 18 images · non-contrast
Comparison: None.

CLINICAL DATA: 30-year-old female with sudden severe headache and
low back pain on set this morning. Lightheaded.



[Series 2: head wo · axial · 0.43mm/px · z∈[+466,+590]mm · 9 of 31 slices shown, 12 images]
[im 3/31  brain]
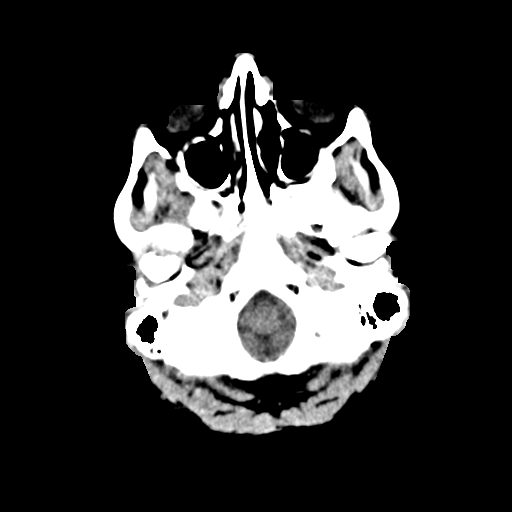
[im 3/31  bone]
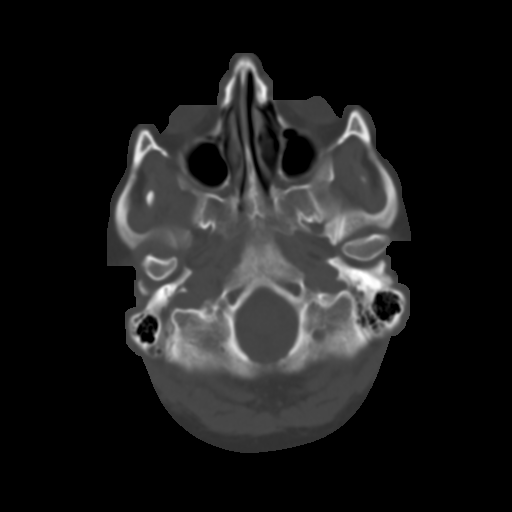
[im 6/31  brain]
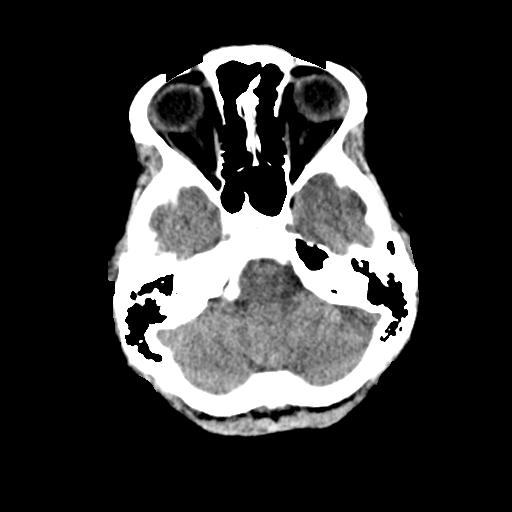
[im 9/31  brain]
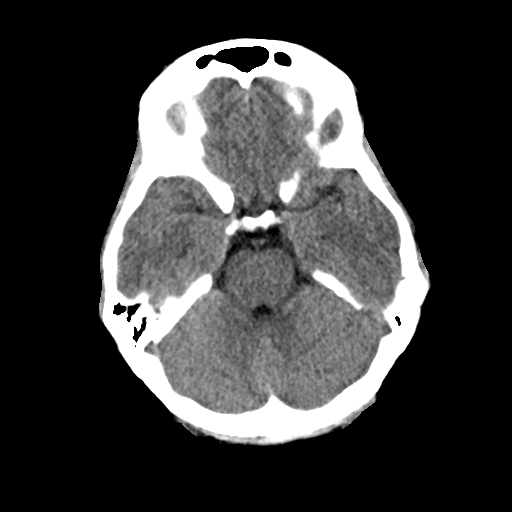
[im 12/31  brain]
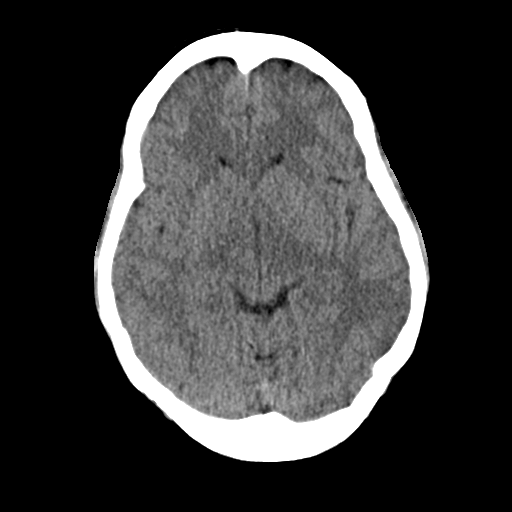
[im 16/31  brain]
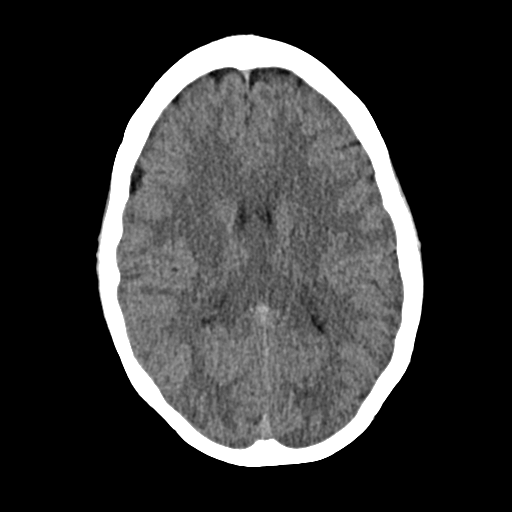
[im 16/31  bone]
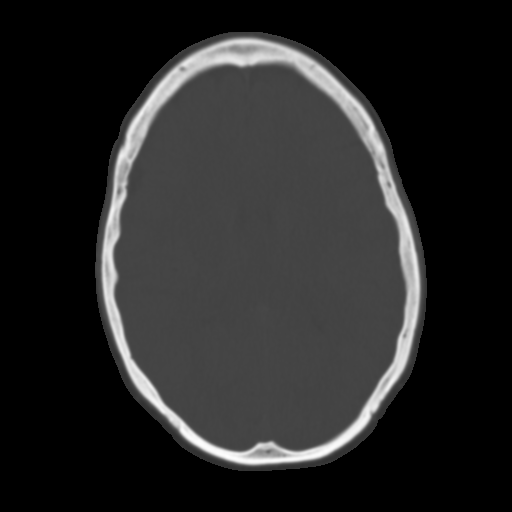
[im 19/31  brain]
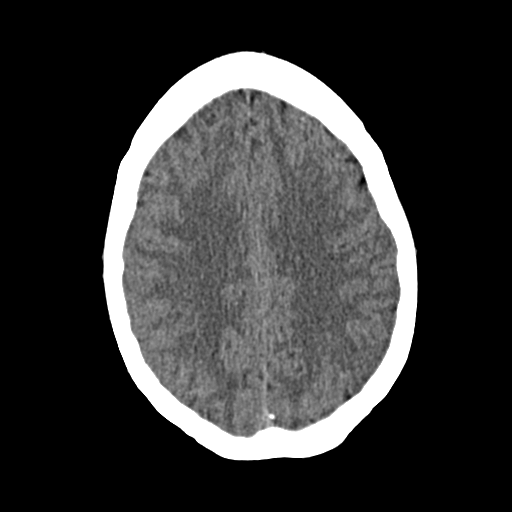
[im 22/31  brain]
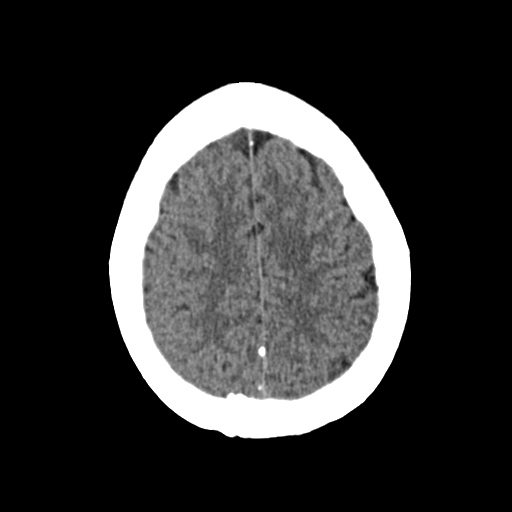
[im 25/31  brain]
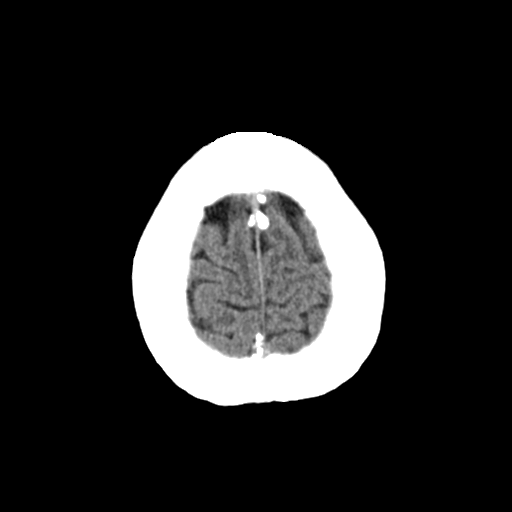
[im 28/31  brain]
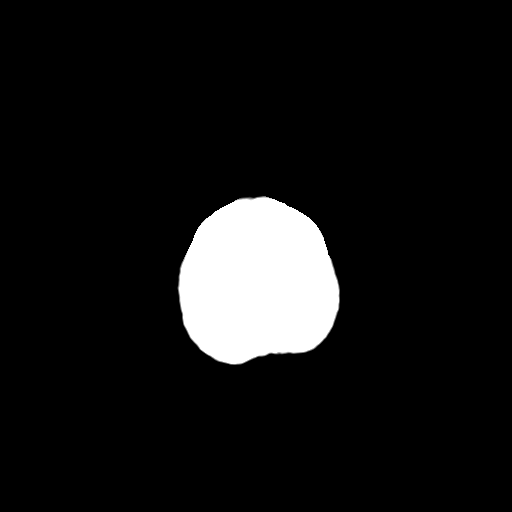
[im 28/31  bone]
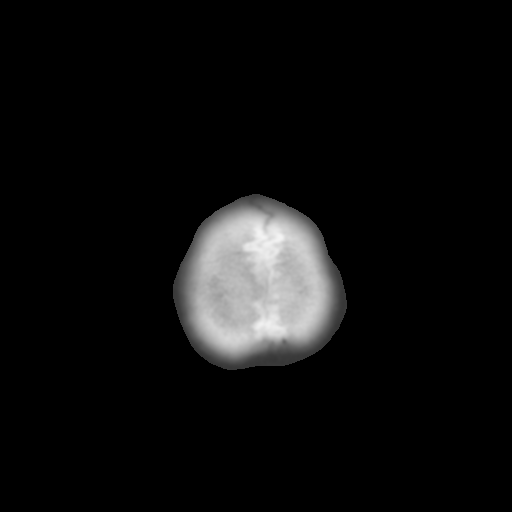

[Series 4: coronal soft · coronal · 0.31mm/px · 3 of 68 slices shown]
[im 23/68  brain]
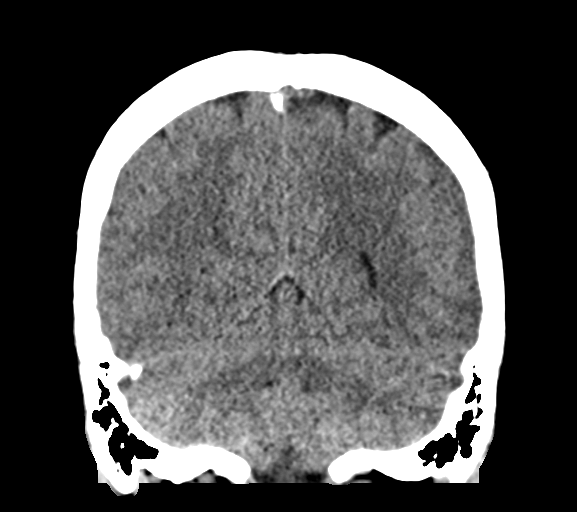
[im 30/68  brain]
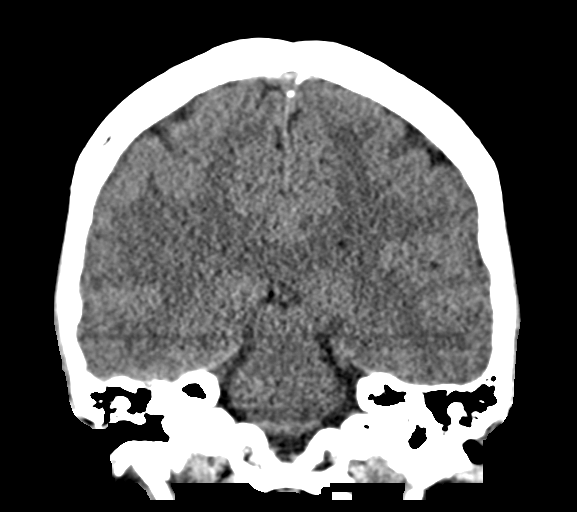
[im 38/68  brain]
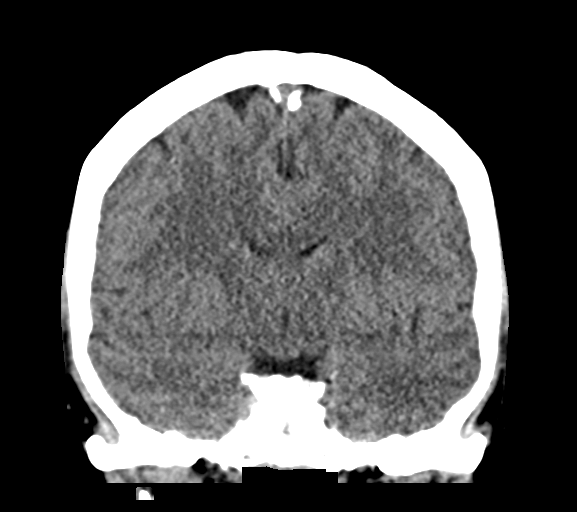

[Series 5: sag soft · sagittal · 0.31mm/px · 3 of 53 slices shown]
[im 18/53  brain]
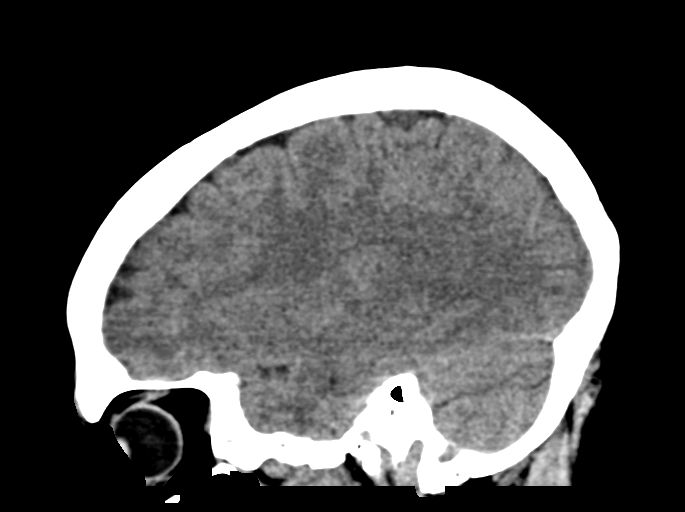
[im 27/53  brain]
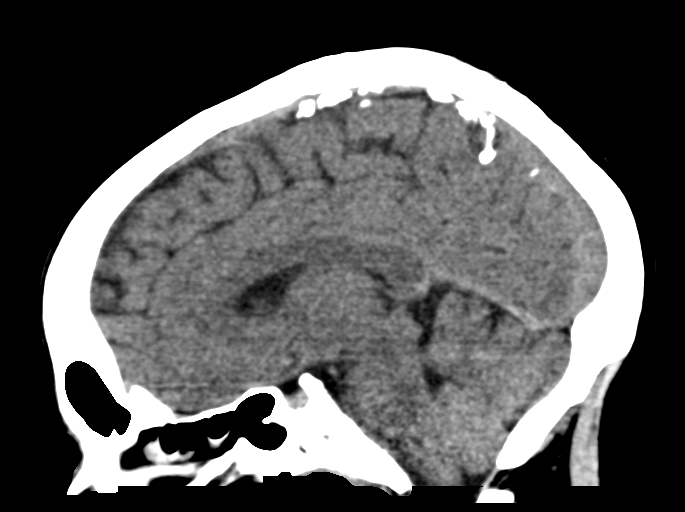
[im 35/53  brain]
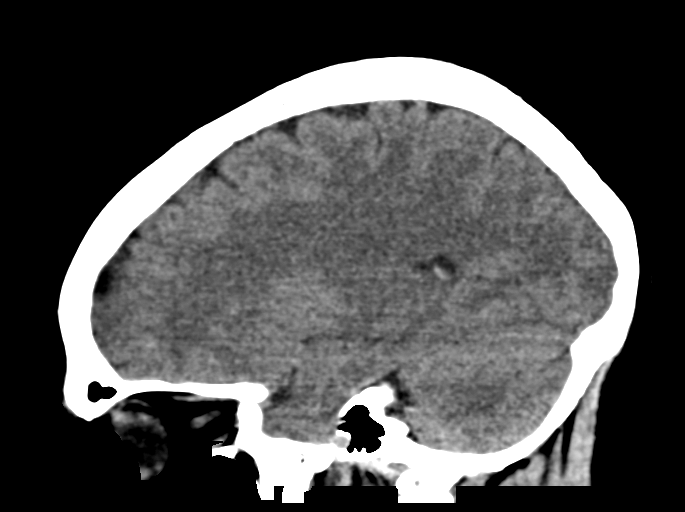

[15 of 47 positions shown; findings below may reference images not displayed]

FINDINGS: Brain: Normal cerebral volume. No midline shift, ventriculomegaly,
mass effect, evidence of mass lesion, intracranial hemorrhage or
evidence of cortically based acute infarction. Gray-white matter
differentiation is within normal limits throughout the brain.

Vascular: No suspicious intracranial vascular hyperdensity.

Skull: Negative.

Sinuses/Orbits: Visualized paranasal sinuses and mastoids are clear.
Tympanic cavities are clear.

Other: Mildly Disconjugate gaze but otherwise negative orbits.
Visualized scalp soft tissues are within normal limits.
IMPRESSION: Normal noncontrast Head CT.
